# Patient Record
Sex: Male | Born: 1985
Health system: Southern US, Community
[De-identification: ages and names within clinical notes are randomized; demographics above are authoritative.]

## PROBLEM LIST (undated history)

## (undated) DIAGNOSIS — D649 Anemia, unspecified: Secondary | ICD-10-CM

## (undated) DIAGNOSIS — R569 Unspecified convulsions: Secondary | ICD-10-CM

## (undated) HISTORY — DX: Anemia, unspecified: D64.9

## (undated) HISTORY — DX: Unspecified convulsions: R56.9

---

## 2006-08-10 ENCOUNTER — Emergency Department: Payer: Self-pay | Admitting: Emergency Medicine

## 2006-10-19 ENCOUNTER — Ambulatory Visit: Payer: Self-pay | Admitting: Psychiatry

## 2009-06-17 ENCOUNTER — Ambulatory Visit: Payer: Self-pay | Admitting: Family Medicine

## 2012-01-19 DIAGNOSIS — Z0000 Encounter for general adult medical examination without abnormal findings: Secondary | ICD-10-CM | POA: Diagnosis not present

## 2012-01-19 DIAGNOSIS — Z23 Encounter for immunization: Secondary | ICD-10-CM | POA: Diagnosis not present

## 2012-01-19 DIAGNOSIS — Z136 Encounter for screening for cardiovascular disorders: Secondary | ICD-10-CM | POA: Diagnosis not present

## 2012-02-15 ENCOUNTER — Emergency Department: Payer: Self-pay | Admitting: Emergency Medicine

## 2012-11-08 DIAGNOSIS — M7650 Patellar tendinitis, unspecified knee: Secondary | ICD-10-CM | POA: Diagnosis not present

## 2012-11-08 DIAGNOSIS — Z23 Encounter for immunization: Secondary | ICD-10-CM | POA: Diagnosis not present

## 2012-11-25 DIAGNOSIS — Z23 Encounter for immunization: Secondary | ICD-10-CM | POA: Diagnosis not present

## 2012-11-25 DIAGNOSIS — M7650 Patellar tendinitis, unspecified knee: Secondary | ICD-10-CM | POA: Diagnosis not present

## 2012-11-30 DIAGNOSIS — M6580 Other synovitis and tenosynovitis, unspecified site: Secondary | ICD-10-CM | POA: Diagnosis not present

## 2013-04-28 ENCOUNTER — Ambulatory Visit: Payer: Self-pay | Admitting: Family Medicine

## 2013-04-28 DIAGNOSIS — R937 Abnormal findings on diagnostic imaging of other parts of musculoskeletal system: Secondary | ICD-10-CM | POA: Diagnosis not present

## 2013-04-28 DIAGNOSIS — M25549 Pain in joints of unspecified hand: Secondary | ICD-10-CM | POA: Diagnosis not present

## 2013-04-28 DIAGNOSIS — M79609 Pain in unspecified limb: Secondary | ICD-10-CM | POA: Diagnosis not present

## 2013-04-28 DIAGNOSIS — S6990XA Unspecified injury of unspecified wrist, hand and finger(s), initial encounter: Secondary | ICD-10-CM | POA: Diagnosis not present

## 2013-04-28 DIAGNOSIS — Z23 Encounter for immunization: Secondary | ICD-10-CM | POA: Diagnosis not present

## 2013-11-01 ENCOUNTER — Ambulatory Visit: Payer: Self-pay | Admitting: Family Medicine

## 2013-11-01 DIAGNOSIS — S99919A Unspecified injury of unspecified ankle, initial encounter: Secondary | ICD-10-CM | POA: Diagnosis not present

## 2013-11-01 DIAGNOSIS — M25569 Pain in unspecified knee: Secondary | ICD-10-CM | POA: Diagnosis not present

## 2013-11-01 DIAGNOSIS — S99929A Unspecified injury of unspecified foot, initial encounter: Secondary | ICD-10-CM | POA: Diagnosis not present

## 2013-11-01 DIAGNOSIS — Z23 Encounter for immunization: Secondary | ICD-10-CM | POA: Diagnosis not present

## 2013-11-01 DIAGNOSIS — M7989 Other specified soft tissue disorders: Secondary | ICD-10-CM | POA: Diagnosis not present

## 2013-11-01 DIAGNOSIS — S8990XA Unspecified injury of unspecified lower leg, initial encounter: Secondary | ICD-10-CM | POA: Diagnosis not present

## 2013-12-21 ENCOUNTER — Ambulatory Visit: Payer: Self-pay | Admitting: Family Medicine

## 2013-12-21 DIAGNOSIS — M25579 Pain in unspecified ankle and joints of unspecified foot: Secondary | ICD-10-CM | POA: Diagnosis not present

## 2013-12-21 DIAGNOSIS — M7989 Other specified soft tissue disorders: Secondary | ICD-10-CM | POA: Diagnosis not present

## 2013-12-21 DIAGNOSIS — S99919A Unspecified injury of unspecified ankle, initial encounter: Secondary | ICD-10-CM | POA: Diagnosis not present

## 2013-12-21 DIAGNOSIS — Z23 Encounter for immunization: Secondary | ICD-10-CM | POA: Diagnosis not present

## 2013-12-21 DIAGNOSIS — S8990XA Unspecified injury of unspecified lower leg, initial encounter: Secondary | ICD-10-CM | POA: Diagnosis not present

## 2014-03-16 IMAGING — CR DG HAND COMPLETE 3+V*L*
1 series · 3 of 3 positions shown · non-contrast
Comparison: None.

CLINICAL DATA: Pain post trauma

EXAM:
LEFT HAND - COMPLETE 3+ VIEW

[Series 1: pa · 0.17mm/px · 3 of 3 slices shown]
[im 1/3]
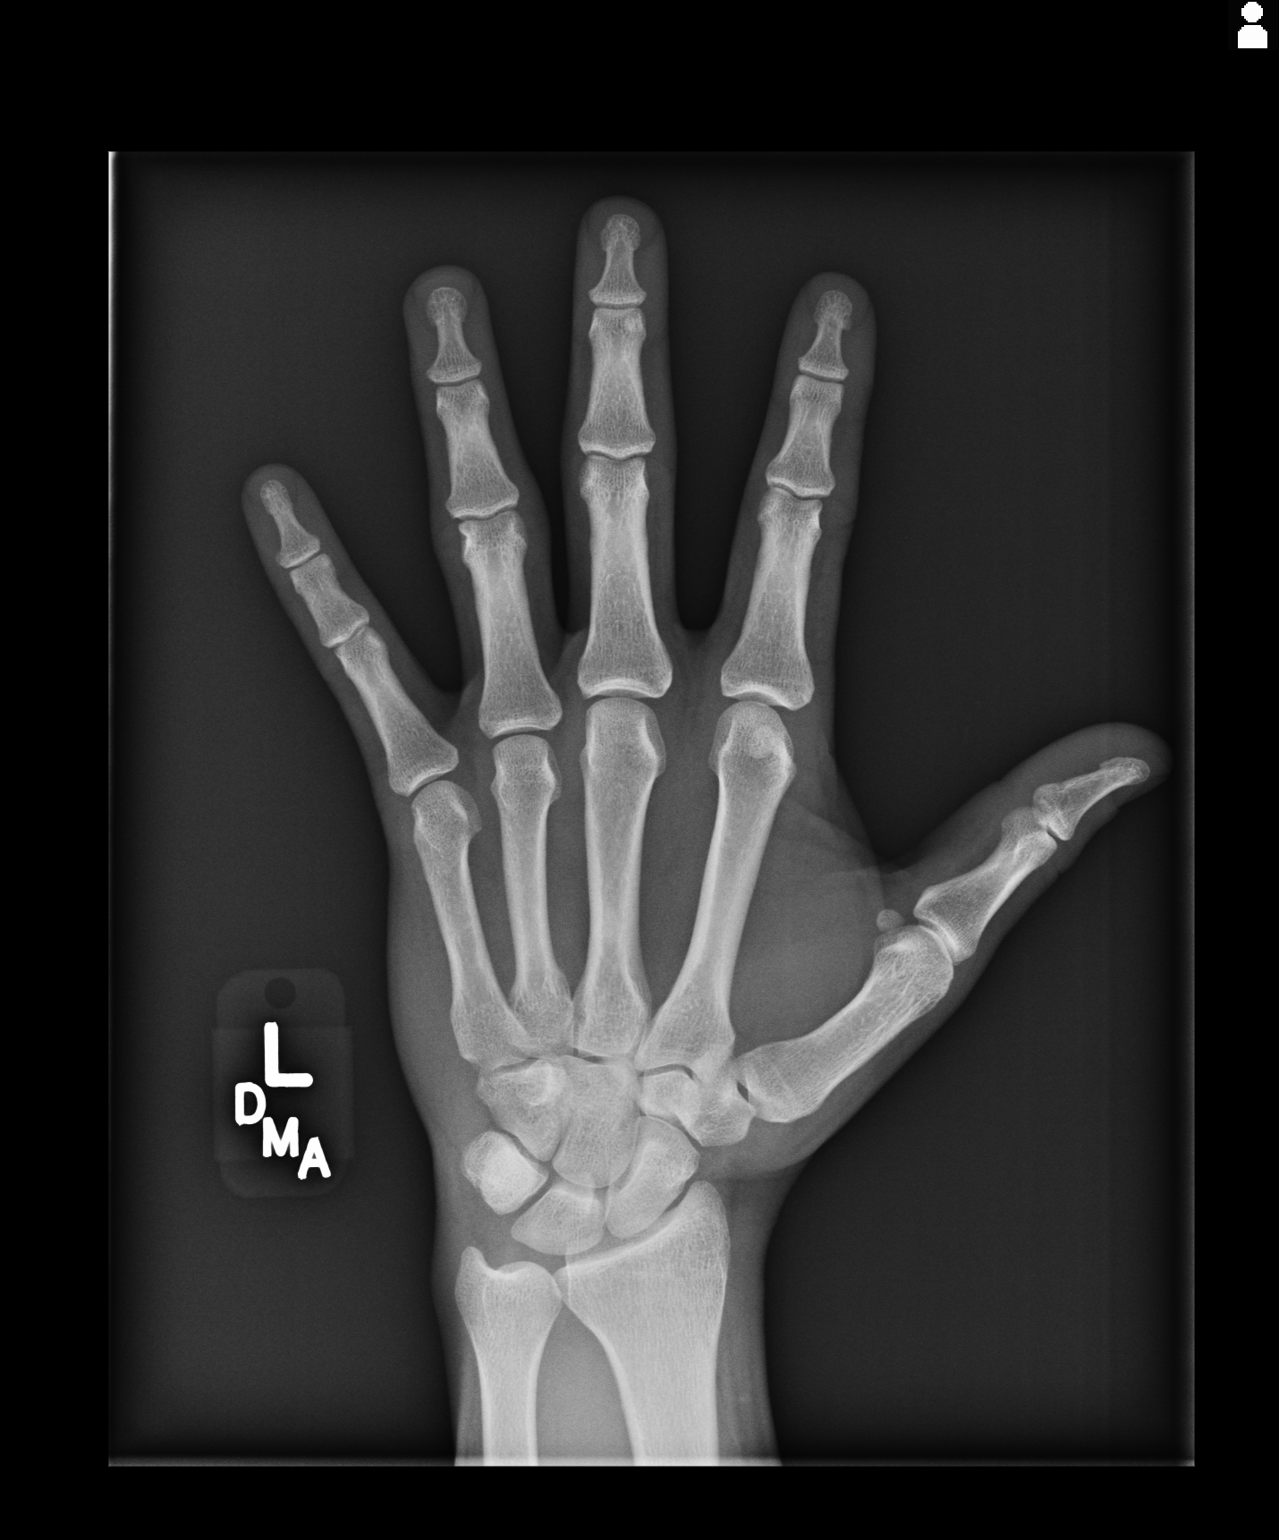
[im 2/3]
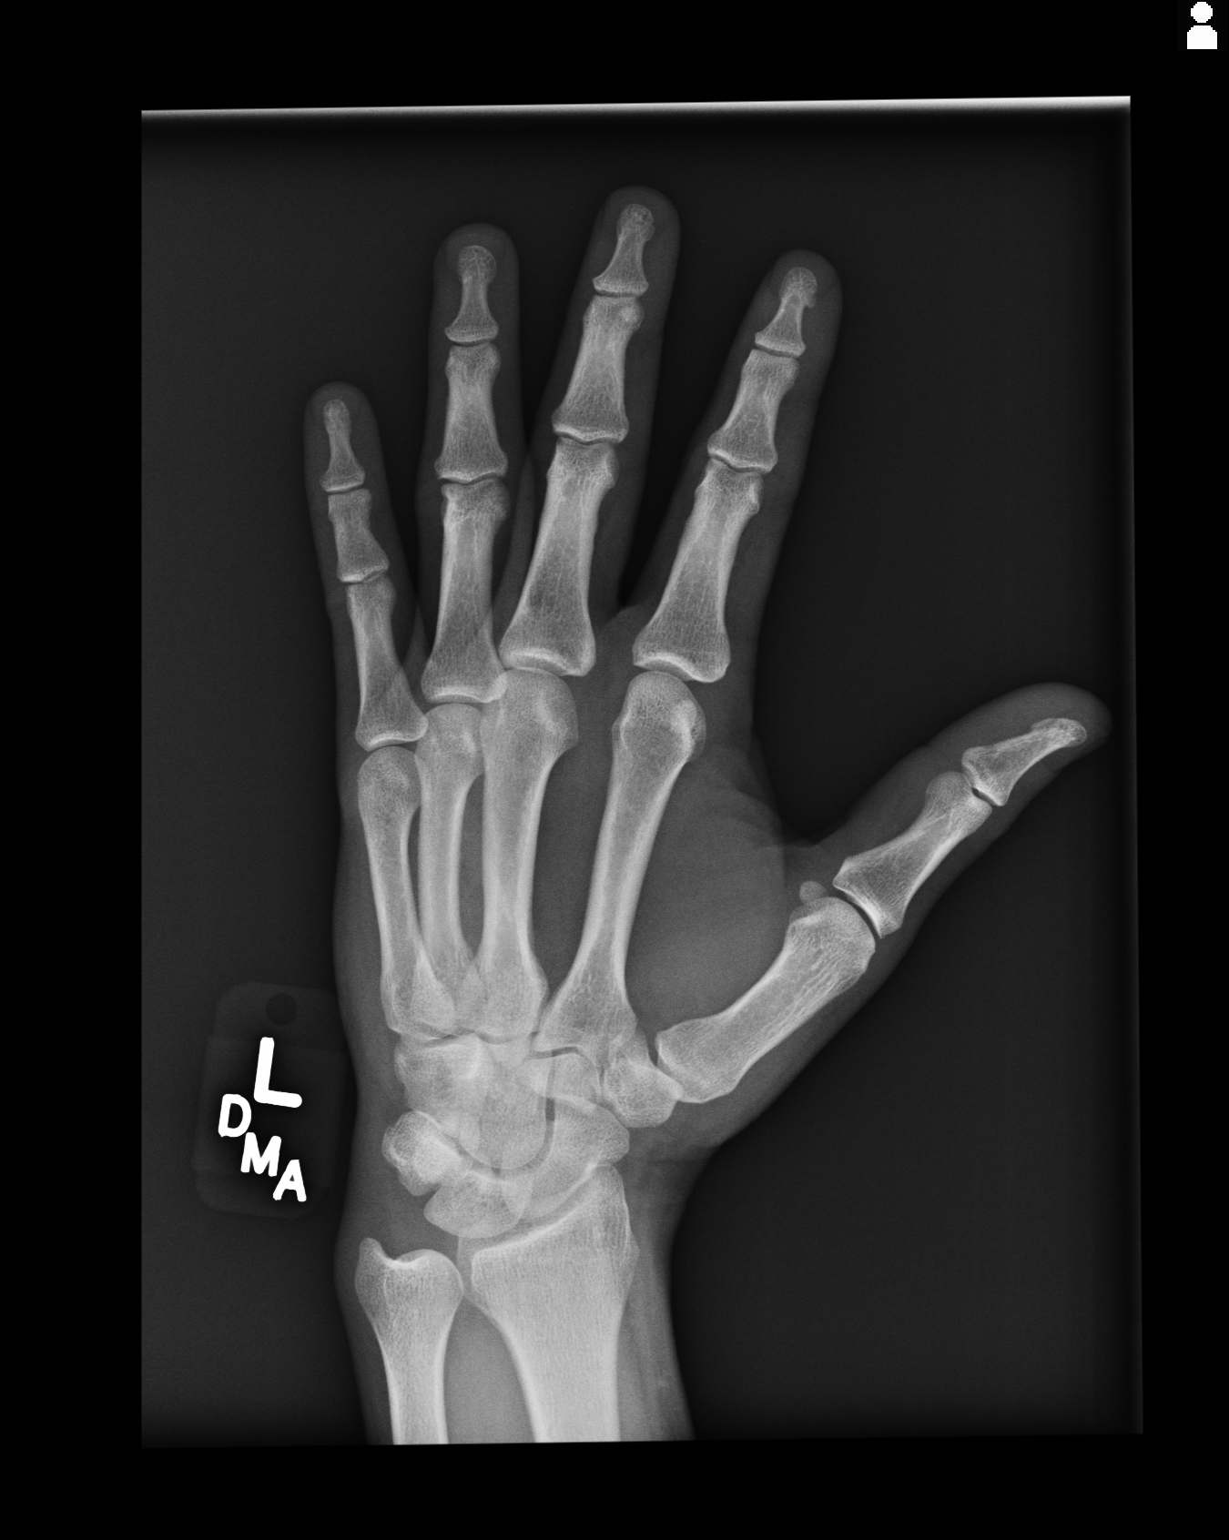
[im 3/3]
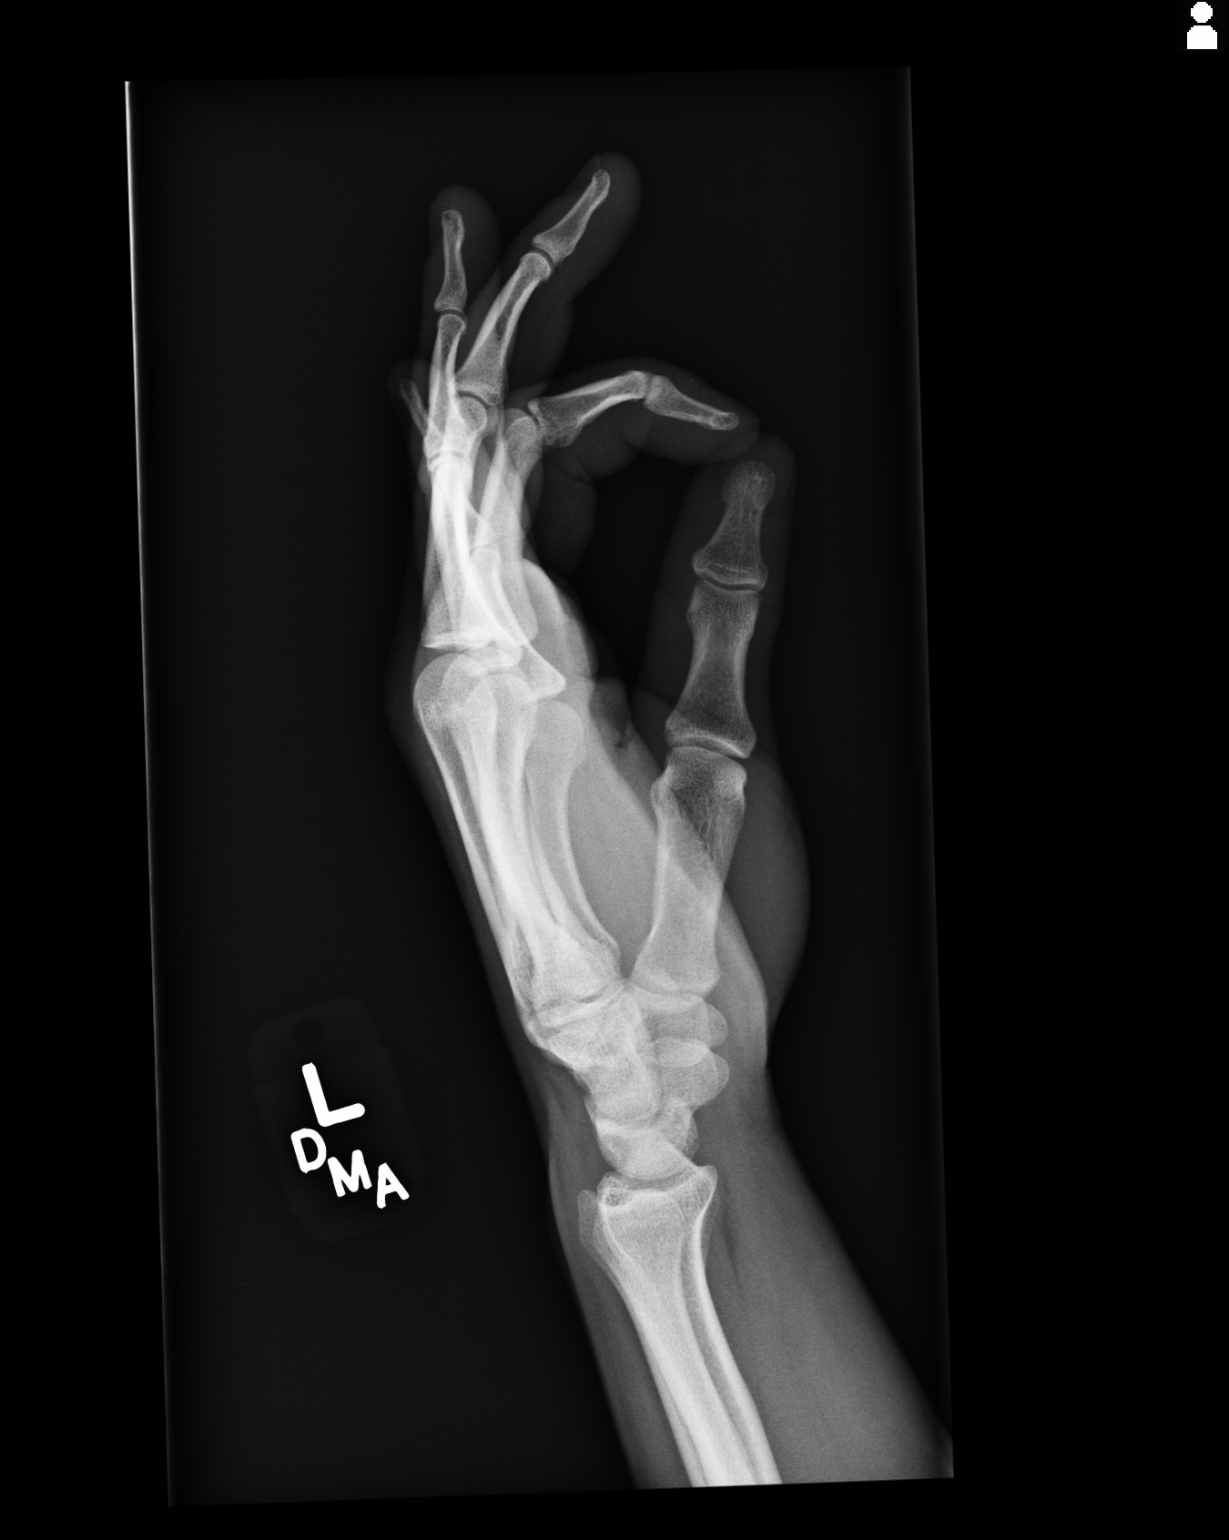

[3 of 3 positions shown; findings below may reference images not displayed]

FINDINGS: Frontal, oblique, and lateral views were obtained. On the lateral
view, there is a questionable non of displaced fracture of the
proximal portion of the 2nd middle phalanx. There is no other
evidence of fracture. No dislocation. Joint spaces appear intact.
IMPRESSION: On the lateral view only, there is a lucency in the proximal portion
of the 2nd middle phalanx suspicious for nondisplaced fracture.
Study otherwise unremarkable.

## 2014-09-19 IMAGING — CR LEFT KNEE - 3 VIEW
1 series · 3 of 3 positions shown · non-contrast
Comparison: None.

CLINICAL DATA: Status post fall on [REDACTED] with anterior knee pain
and swelling.

EXAM:
LEFT KNEE - 3 VIEW

[Series 1: ap · 0.17mm/px · 3 of 3 slices shown]
[im 1/3]
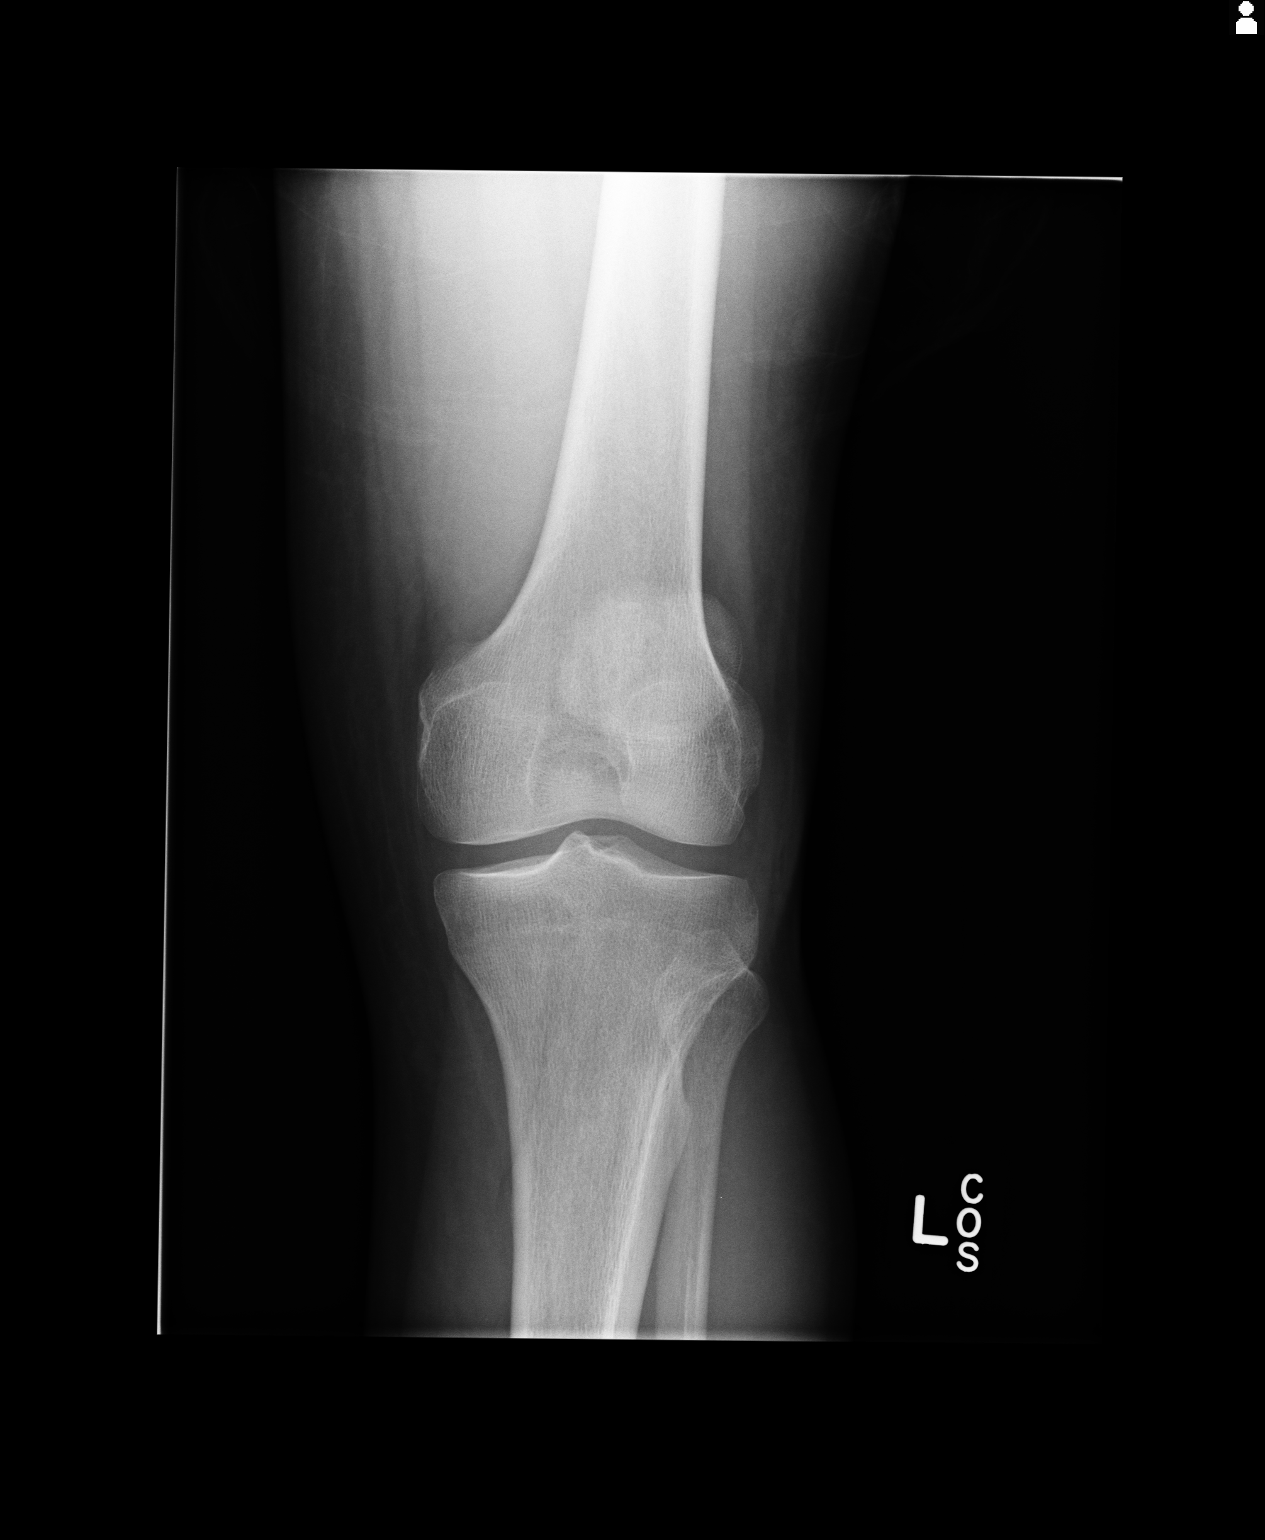
[im 2/3]
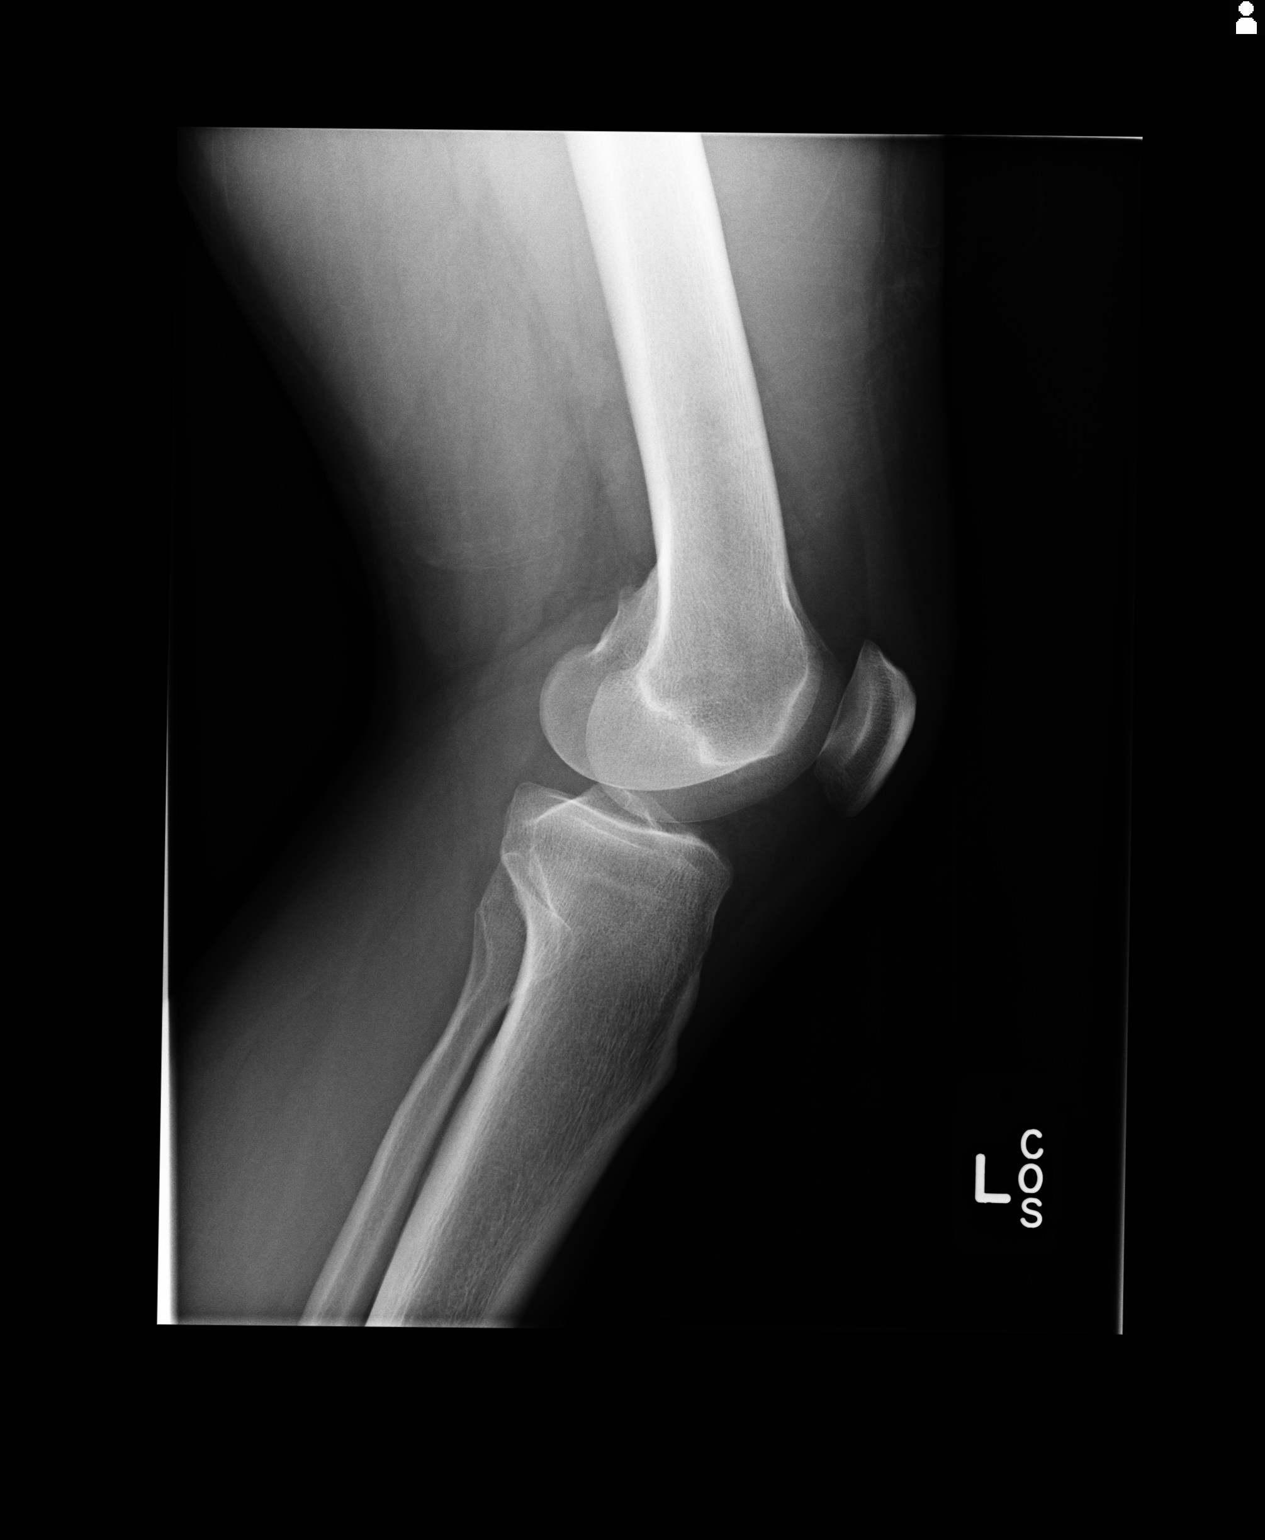
[im 3/3]
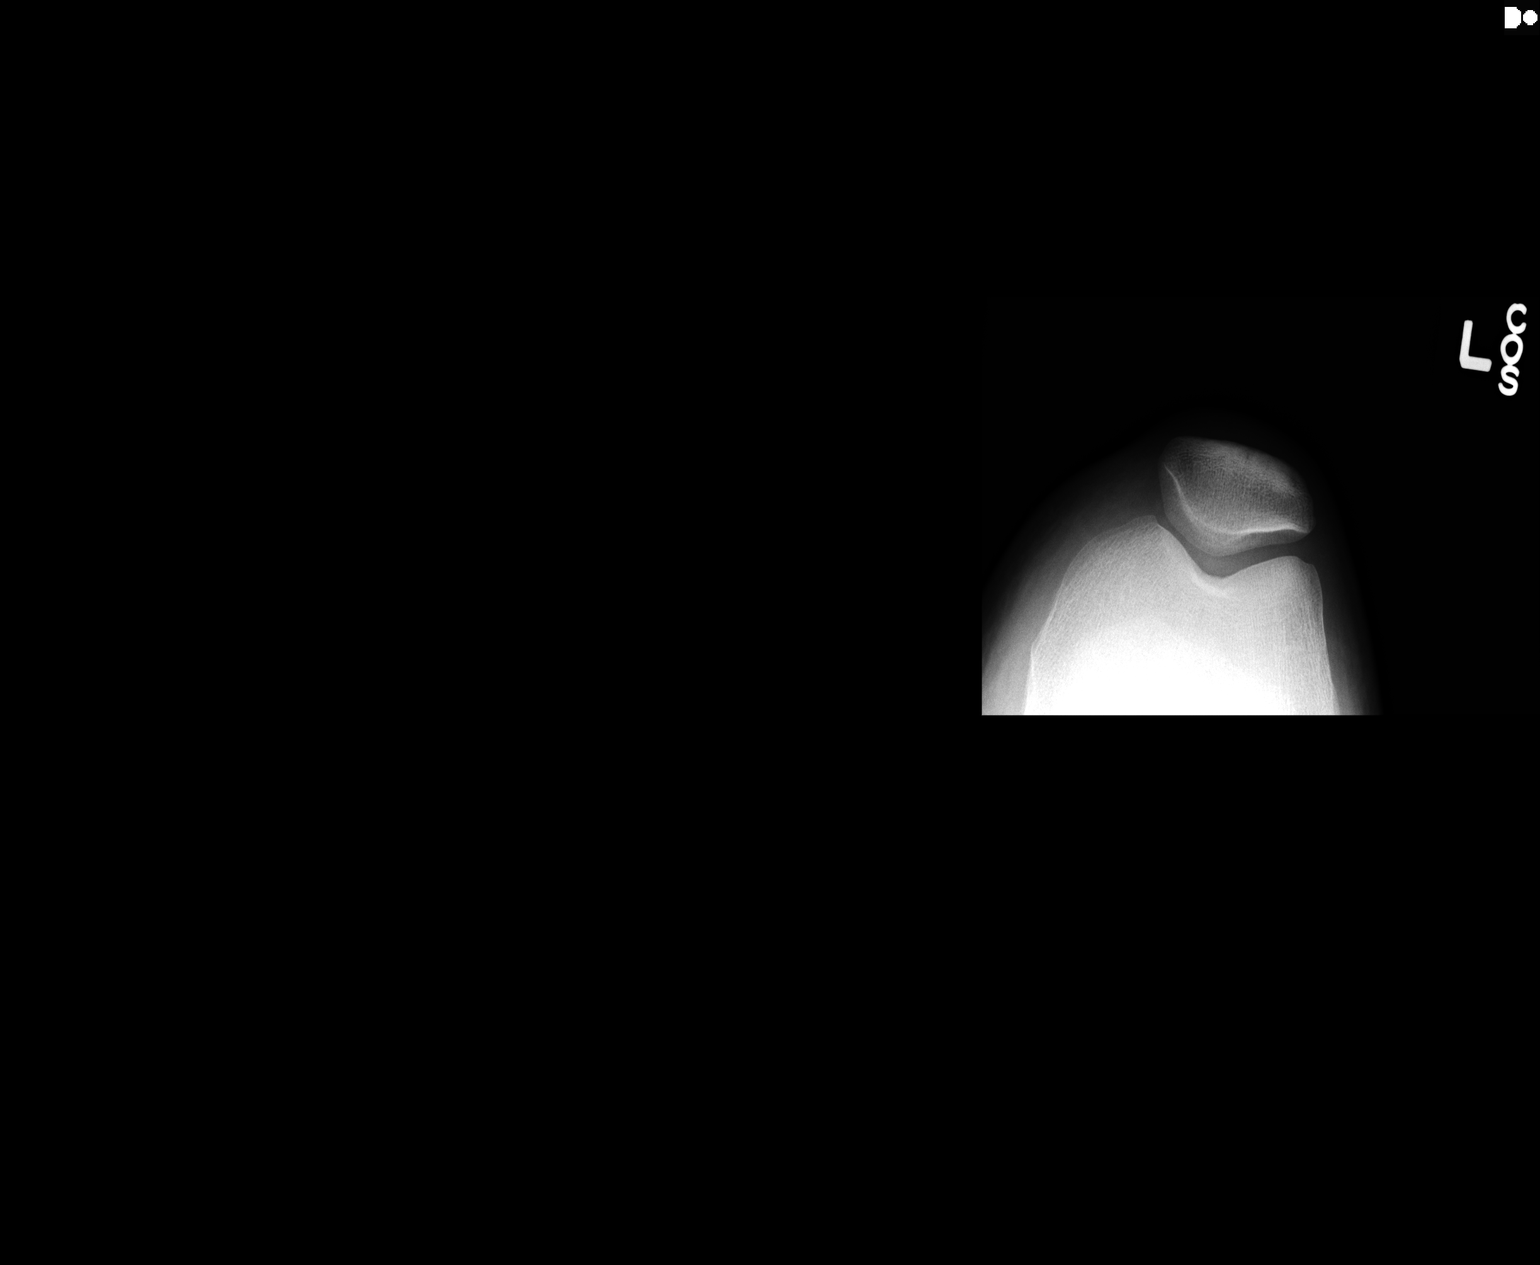

[3 of 3 positions shown; findings below may reference images not displayed]

FINDINGS: There is no evidence of fracture, dislocation, or joint effusion.
There is no evidence of arthropathy or other focal bone abnormality.
Soft tissues are unremarkable.
IMPRESSION: No acute fracture or dislocation.

## 2014-11-08 IMAGING — CR RIGHT ANKLE - COMPLETE 3+ VIEW
1 series · 3 of 3 positions shown · non-contrast
Comparison: None.

CLINICAL DATA: Right ankle pain.

EXAM:
RIGHT ANKLE - COMPLETE 3+ VIEW

[Series 1: kdxr ankle right complete · 0.14mm/px · 3 of 3 slices shown]
[im 1/3]
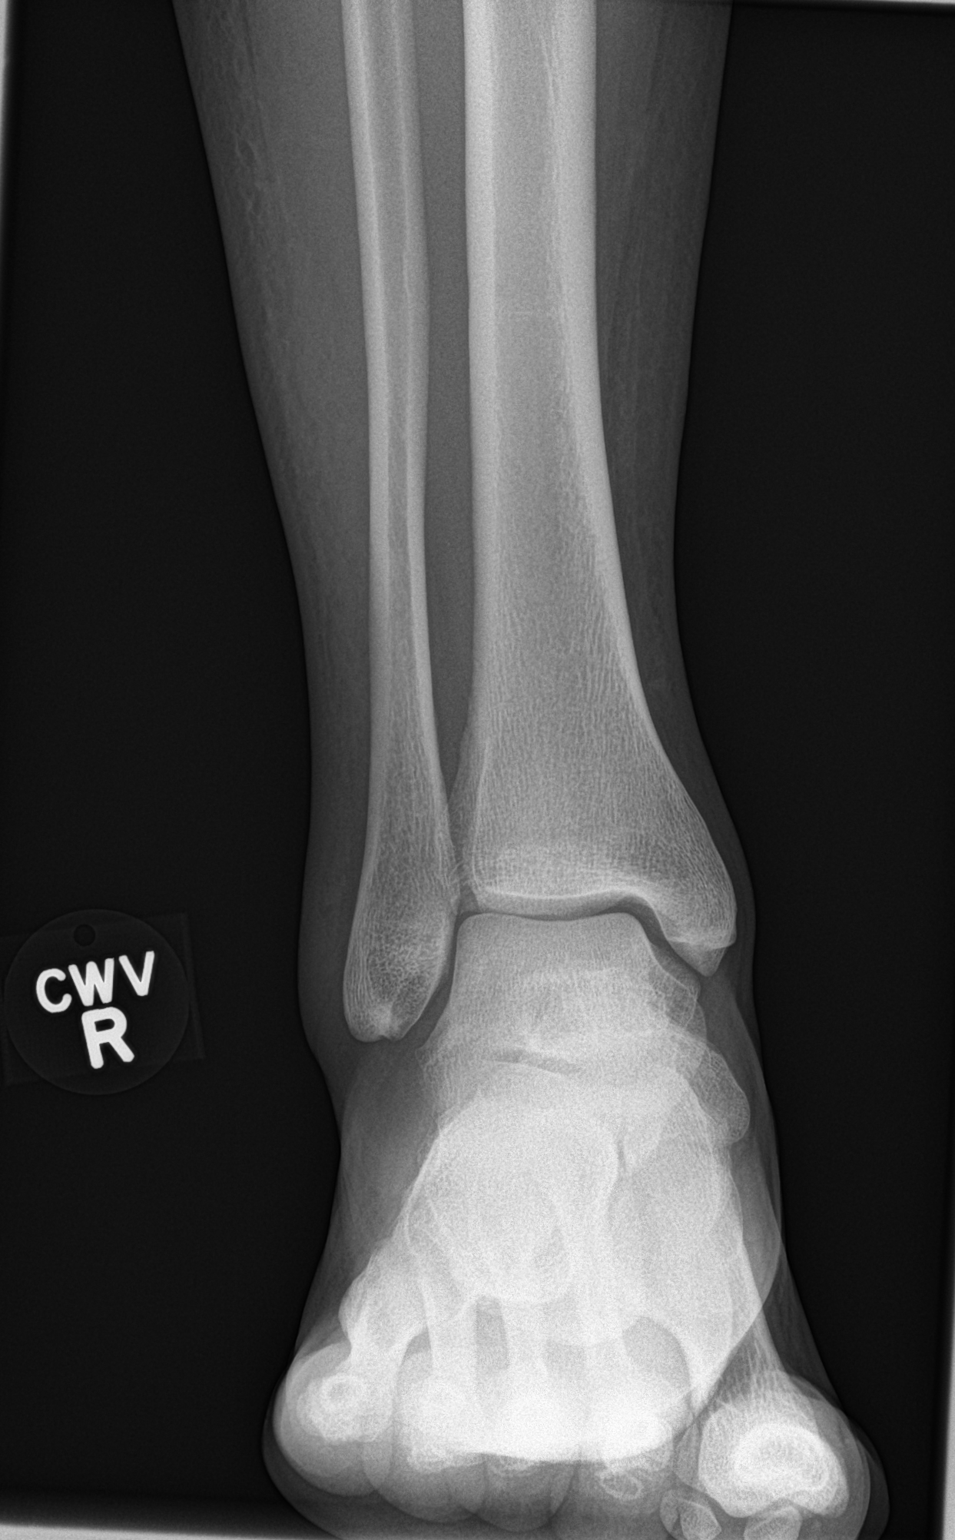
[im 2/3]
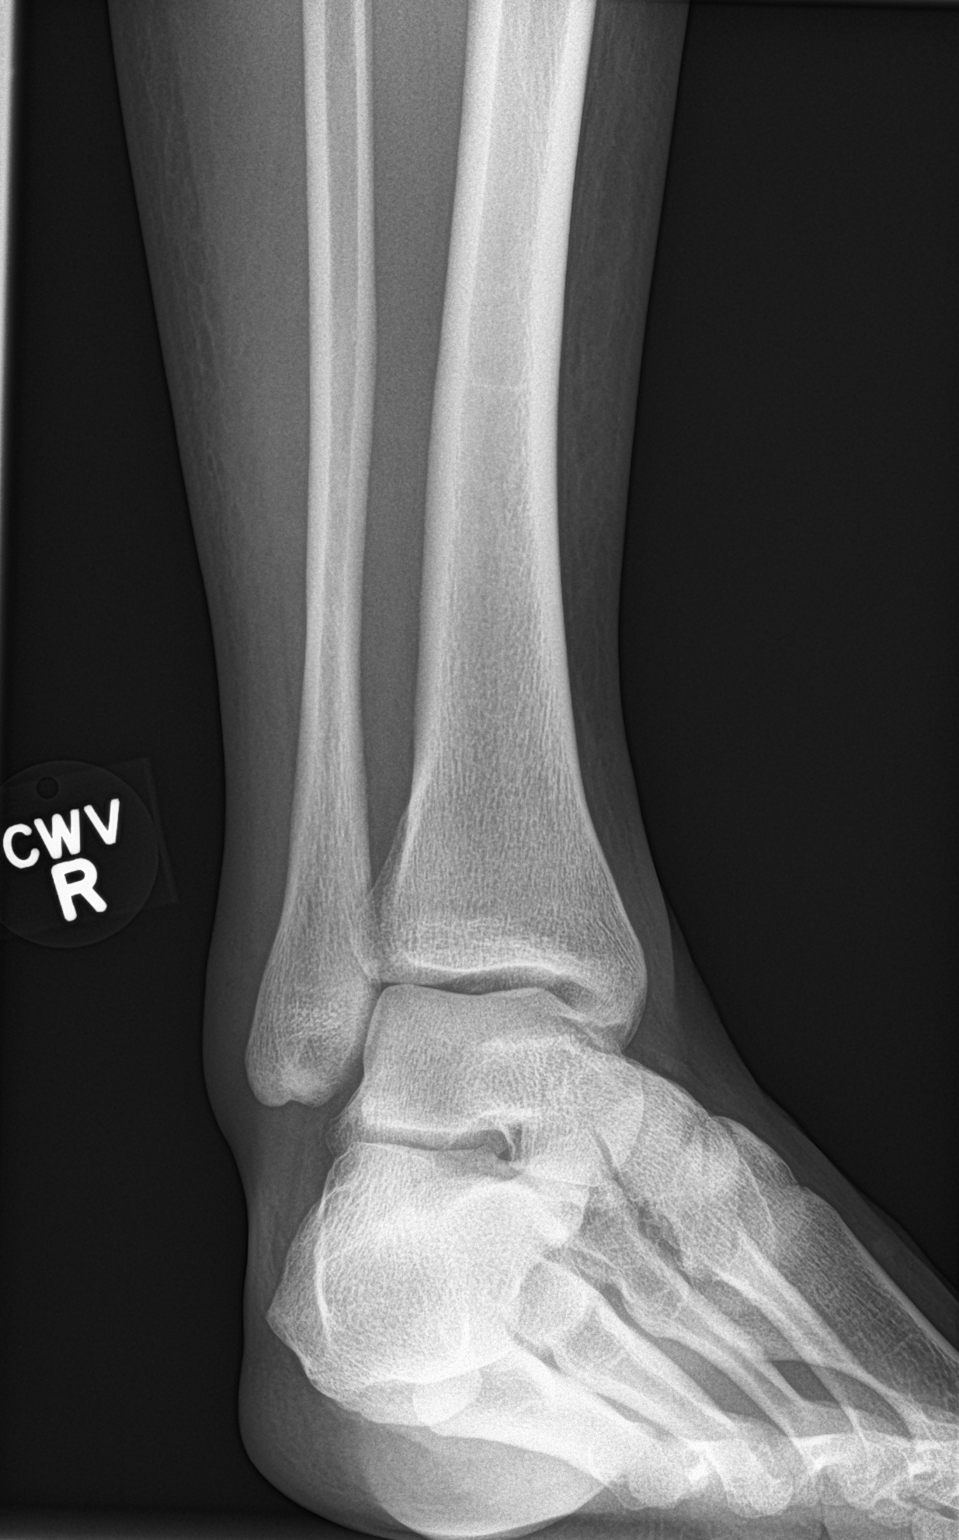
[im 3/3]
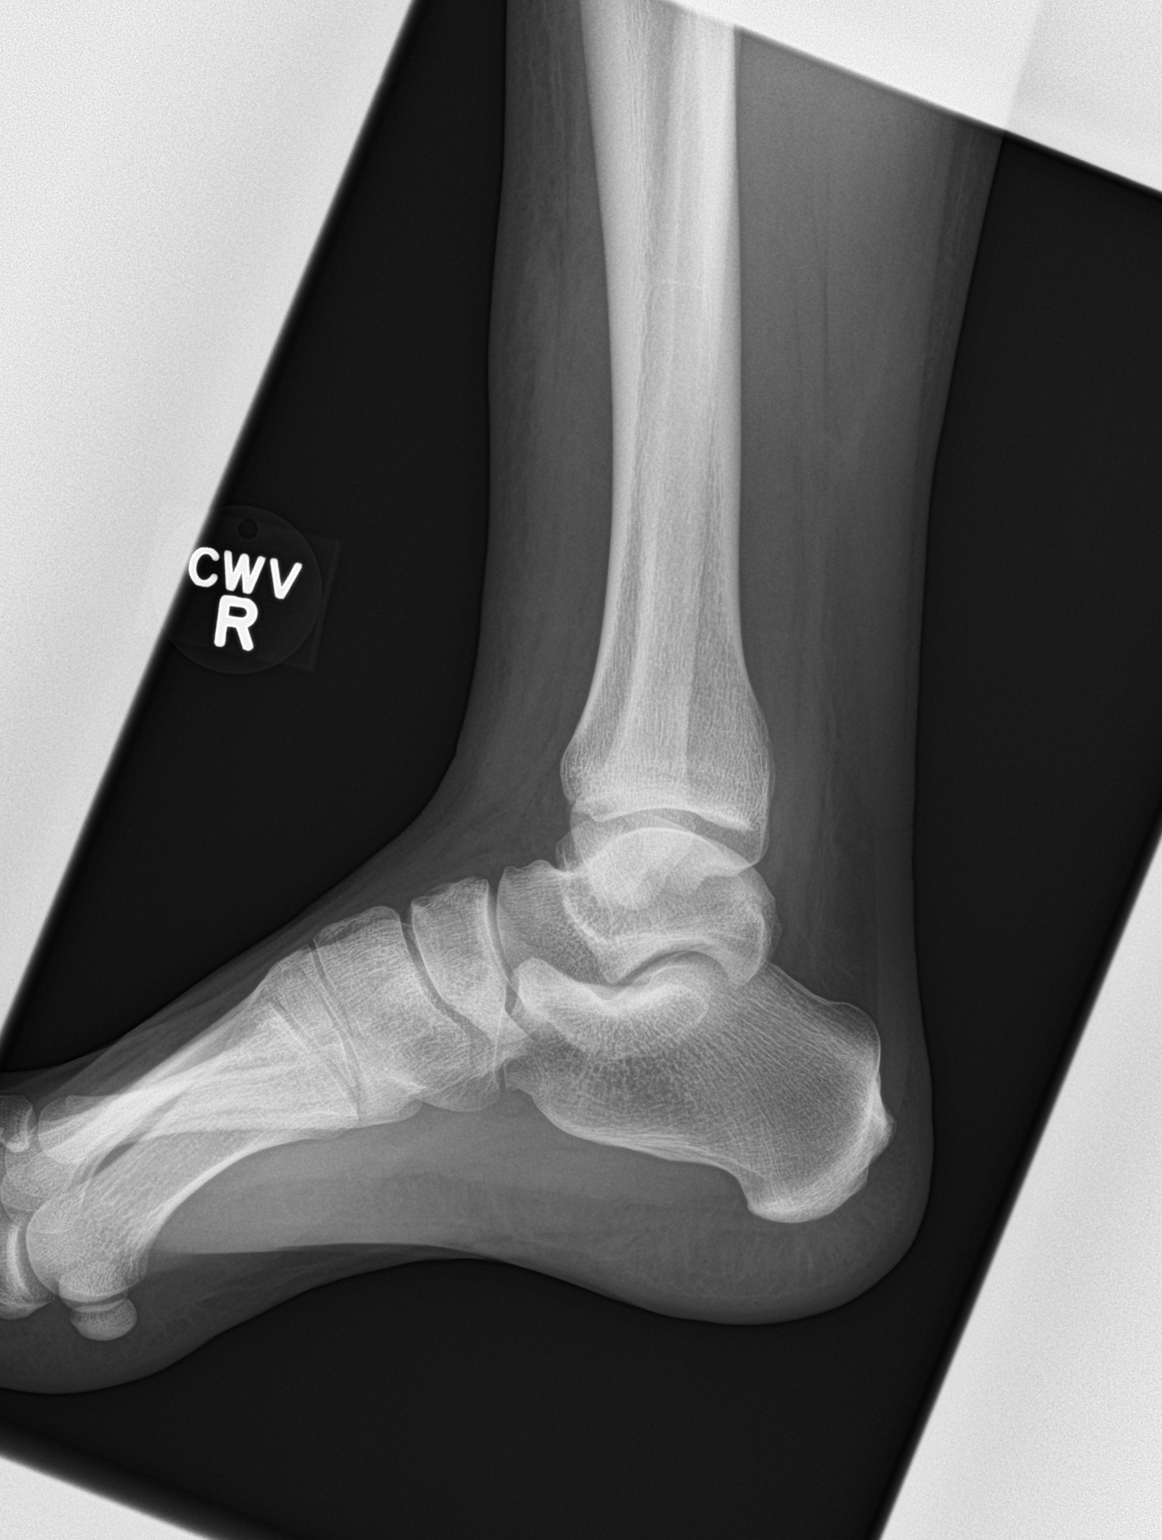

[3 of 3 positions shown; findings below may reference images not displayed]

FINDINGS: There is no evidence of fracture, dislocation, or joint effusion.
There is no evidence of arthropathy or other focal bone abnormality.
Soft tissue swelling is seen over lateral malleolus suggesting
ligamentous injury.
IMPRESSION: No fracture or dislocation is noted. Soft tissue swelling seen over
lateral malleolus suggesting ligamentous injury.

## 2016-11-18 DIAGNOSIS — S43401A Unspecified sprain of right shoulder joint, initial encounter: Secondary | ICD-10-CM | POA: Diagnosis not present

## 2022-02-26 ENCOUNTER — Emergency Department: Payer: No Typology Code available for payment source

## 2022-02-26 ENCOUNTER — Emergency Department
Admission: EM | Admit: 2022-02-26 | Discharge: 2022-02-27 | Disposition: A | Discharge status: Home / Self Care | Payer: No Typology Code available for payment source | Attending: Emergency Medicine | Admitting: Emergency Medicine

## 2022-02-26 ENCOUNTER — Other Ambulatory Visit: Payer: Self-pay

## 2022-02-26 DIAGNOSIS — R569 Unspecified convulsions: Secondary | ICD-10-CM | POA: Diagnosis not present

## 2022-02-26 DIAGNOSIS — R410 Disorientation, unspecified: Secondary | ICD-10-CM | POA: Diagnosis not present

## 2022-02-26 DIAGNOSIS — E876 Hypokalemia: Secondary | ICD-10-CM

## 2022-02-26 DIAGNOSIS — R9431 Abnormal electrocardiogram [ECG] [EKG]: Secondary | ICD-10-CM | POA: Diagnosis not present

## 2022-02-26 LAB — COMPREHENSIVE METABOLIC PANEL
ALT: 17 U/L (ref 0–44)
AST: 21 U/L (ref 15–41)
Albumin: 4.5 g/dL (ref 3.5–5.0)
Alkaline Phosphatase: 82 U/L (ref 38–126)
Anion gap: 8 (ref 5–15)
BUN: 9 mg/dL (ref 6–20)
CO2: 26 mmol/L (ref 22–32)
Calcium: 8.9 mg/dL (ref 8.9–10.3)
Chloride: 103 mmol/L (ref 98–111)
Creatinine, Ser: 1.11 mg/dL (ref 0.61–1.24)
GFR, Estimated: >60 mL/min (ref >60)
Glucose, Bld: 132 mg/dL — ABNORMAL HIGH (ref 70–99)
Potassium: 3.2 mmol/L — ABNORMAL LOW (ref 3.5–5.1)
Sodium: 137 mmol/L (ref 135–145)
Total Bilirubin: 1.5 mg/dL — ABNORMAL HIGH (ref 0.3–1.2)
Total Protein: 7.7 g/dL (ref 6.5–8.1)

## 2022-02-26 LAB — CBC
HCT: 45.3 % (ref 39.0–52.0)
Hemoglobin: 15.4 g/dL (ref 13.0–17.0)
MCH: 30.8 pg (ref 26.0–34.0)
MCHC: 34 g/dL (ref 30.0–36.0)
MCV: 90.6 fL (ref 80.0–100.0)
Platelets: 251 10*3/uL (ref 150–400)
RBC: 5 MIL/uL (ref 4.22–5.81)
RDW: 11.3 % — ABNORMAL LOW (ref 11.5–15.5)
WBC: 7 10*3/uL (ref 4.0–10.5)
nRBC: 0 % (ref 0.0–0.2)

## 2022-02-26 MED ORDER — LEVETIRACETAM IN NACL 1500 MG/100ML IV SOLN
1500.0000 mg | Freq: Once | INTRAVENOUS | Status: AC
  Administered 2022-02-26: 1500 mg via INTRAVENOUS
  Filled 2022-02-26: qty 100

## 2022-02-26 MED ORDER — SODIUM CHLORIDE 0.9 % IV BOLUS
1000.0000 mL | Freq: Once | INTRAVENOUS | Status: AC
  Administered 2022-02-26: 1000 mL via INTRAVENOUS

## 2022-02-26 MED ORDER — POTASSIUM CHLORIDE CRYS ER 20 MEQ PO TBCR
40.0000 meq | EXTENDED_RELEASE_TABLET | Freq: Once | ORAL | Status: AC
  Administered 2022-02-27: 40 meq via ORAL
  Filled 2022-02-26: qty 2

## 2022-02-26 MED ORDER — LEVETIRACETAM 500 MG PO TABS: 500.0000 mg | ORAL_TABLET | Freq: Two times a day (BID) | ORAL | 1 refills | Status: DC

## 2022-02-26 NOTE — ED Triage Notes (Signed)
Pt coming from home via [facility]. Per EMS, pt's sister heard pt fall and hit his head. Pt had seizure like activity for about 1.5 minutes. Pt has a hx of TBI and seizures but pt's last seizure was about 15 years.   Pt is currently A&Ox4 upon arrival. Vital signs were stable with EMS.

## 2022-02-26 NOTE — Discharge Instructions (Signed)
Please call the number provided for neurology to arrange a follow-up appointment soon as possible.  Please begin taking your antiseizure medicine.  Return to the emergency department for any further seizure-like activity.  Do not drive or swim alone until you have been cleared by neurology.
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
## 2022-02-27 DIAGNOSIS — R569 Unspecified convulsions: Secondary | ICD-10-CM | POA: Diagnosis not present

## 2022-02-27 LAB — URINALYSIS, COMPLETE (UACMP) WITH MICROSCOPIC
Bacteria, UA: NONE SEEN
Bilirubin Urine: NEGATIVE
Glucose, UA: NEGATIVE mg/dL
Hgb urine dipstick: NEGATIVE
Ketones, ur: NEGATIVE mg/dL
Leukocytes,Ua: NEGATIVE
Nitrite: NEGATIVE
Protein, ur: NEGATIVE mg/dL
Specific Gravity, Urine: 1.015 (ref 1.005–1.030)
Squamous Epithelial / HPF: NONE SEEN (ref 0–5)
pH: 7.5 (ref 5.0–8.0)

## 2022-02-27 LAB — URINE DRUG SCREEN, QUALITATIVE (ARMC ONLY)
Amphetamines, Ur Screen: NOT DETECTED
Barbiturates, Ur Screen: NOT DETECTED
Benzodiazepine, Ur Scrn: NOT DETECTED
Cannabinoid 50 Ng, Ur ~~LOC~~: NOT DETECTED
Cocaine Metabolite,Ur ~~LOC~~: NOT DETECTED
MDMA (Ecstasy)Ur Screen: NOT DETECTED
Methadone Scn, Ur: NOT DETECTED
Opiate, Ur Screen: NOT DETECTED
Phencyclidine (PCP) Ur S: NOT DETECTED
Tricyclic, Ur Screen: NOT DETECTED

## 2022-02-27 NOTE — ED Notes (Signed)
Pt verbalized understanding of discharge instructions, prescriptions, and follow-up care instructions.  

## 2022-02-27 NOTE — ED Provider Notes (Signed)
Patient received in signout from Dr. Kerman Passey pending remainder of work-up with CT scan and UA.  This returns quite reassuring without evidence of intracranial pathology acutely, UTI, sepsis or other derangements.  He is noted to be slightly hypokalemic, and this repleted orally.  He is observed for nearly 4 hours without recurrence of seizure activity and he remains at his baseline.  Discussed with patient and family to follow-up with neurology, start Keppra and we discussed return precautions.  Answered questions.   Vladimir Crofts, MD 02/27/22 [phone]

## 2022-05-21 DIAGNOSIS — E876 Hypokalemia: Secondary | ICD-10-CM | POA: Diagnosis not present

## 2022-05-21 DIAGNOSIS — Z87820 Personal history of traumatic brain injury: Secondary | ICD-10-CM | POA: Diagnosis not present

## 2022-05-21 DIAGNOSIS — E538 Deficiency of other specified B group vitamins: Secondary | ICD-10-CM | POA: Diagnosis not present

## 2022-05-21 DIAGNOSIS — R4189 Other symptoms and signs involving cognitive functions and awareness: Secondary | ICD-10-CM | POA: Diagnosis not present

## 2022-05-21 DIAGNOSIS — R569 Unspecified convulsions: Secondary | ICD-10-CM | POA: Diagnosis not present

## 2022-06-10 DIAGNOSIS — R569 Unspecified convulsions: Secondary | ICD-10-CM | POA: Diagnosis not present

## 2022-07-17 DIAGNOSIS — L0291 Cutaneous abscess, unspecified: Secondary | ICD-10-CM | POA: Diagnosis not present

## 2022-07-17 DIAGNOSIS — Z87820 Personal history of traumatic brain injury: Secondary | ICD-10-CM | POA: Diagnosis not present

## 2022-07-17 DIAGNOSIS — Z23 Encounter for immunization: Secondary | ICD-10-CM | POA: Diagnosis not present

## 2022-07-17 DIAGNOSIS — M79645 Pain in left finger(s): Secondary | ICD-10-CM | POA: Diagnosis not present

## 2022-07-17 DIAGNOSIS — R569 Unspecified convulsions: Secondary | ICD-10-CM | POA: Diagnosis not present

## 2022-07-17 DIAGNOSIS — L539 Erythematous condition, unspecified: Secondary | ICD-10-CM | POA: Diagnosis not present

## 2022-07-17 DIAGNOSIS — L02512 Cutaneous abscess of left hand: Secondary | ICD-10-CM | POA: Diagnosis not present

## 2022-07-17 DIAGNOSIS — M7989 Other specified soft tissue disorders: Secondary | ICD-10-CM | POA: Diagnosis not present

## 2023-01-24 DIAGNOSIS — R21 Rash and other nonspecific skin eruption: Secondary | ICD-10-CM | POA: Diagnosis not present

## 2023-01-24 DIAGNOSIS — S0101XA Laceration without foreign body of scalp, initial encounter: Secondary | ICD-10-CM | POA: Diagnosis not present

## 2023-01-24 DIAGNOSIS — R55 Syncope and collapse: Secondary | ICD-10-CM | POA: Diagnosis not present

## 2023-01-31 DIAGNOSIS — Z4802 Encounter for removal of sutures: Secondary | ICD-10-CM | POA: Diagnosis not present

## 2023-09-13 ENCOUNTER — Emergency Department: Admission: EM | Admit: 2023-09-13 | Discharge: 2023-09-13 | Disposition: A | Discharge status: Home / Self Care

## 2023-09-13 ENCOUNTER — Other Ambulatory Visit: Payer: Self-pay

## 2023-09-13 DIAGNOSIS — G40909 Epilepsy, unspecified, not intractable, without status epilepticus: Secondary | ICD-10-CM | POA: Diagnosis not present

## 2023-09-13 DIAGNOSIS — R569 Unspecified convulsions: Secondary | ICD-10-CM | POA: Diagnosis not present

## 2023-09-13 LAB — CBC WITH DIFFERENTIAL/PLATELET
Abs Immature Granulocytes: 0.03 10*3/uL (ref 0.00–0.07)
Basophils Absolute: 0.1 10*3/uL (ref 0.0–0.1)
Basophils Relative: 1 %
Eosinophils Absolute: 0.2 10*3/uL (ref 0.0–0.5)
Eosinophils Relative: 2 %
HCT: 44 % (ref 39.0–52.0)
Hemoglobin: 15.7 g/dL (ref 13.0–17.0)
Immature Granulocytes: 0 %
Lymphocytes Relative: 15 %
Lymphs Abs: 1.2 10*3/uL (ref 0.7–4.0)
MCH: 32.2 pg (ref 26.0–34.0)
MCHC: 35.7 g/dL (ref 30.0–36.0)
MCV: 90.3 fL (ref 80.0–100.0)
Monocytes Absolute: 0.7 10*3/uL (ref 0.1–1.0)
Monocytes Relative: 8 %
Neutro Abs: 6.1 10*3/uL (ref 1.7–7.7)
Neutrophils Relative %: 74 %
Platelets: 207 10*3/uL (ref 150–400)
RBC: 4.87 MIL/uL (ref 4.22–5.81)
RDW: 11.4 % — ABNORMAL LOW (ref 11.5–15.5)
WBC: 8.3 10*3/uL (ref 4.0–10.5)
nRBC: 0 % (ref 0.0–0.2)

## 2023-09-13 LAB — BASIC METABOLIC PANEL WITH GFR
Anion gap: 10 (ref 5–15)
BUN: 13 mg/dL (ref 6–20)
CO2: 23 mmol/L (ref 22–32)
Calcium: 8.9 mg/dL (ref 8.9–10.3)
Chloride: 105 mmol/L (ref 98–111)
Creatinine, Ser: 0.98 mg/dL (ref 0.61–1.24)
GFR, Estimated: >60 mL/min (ref >60)
Glucose, Bld: 88 mg/dL (ref 70–99)
Potassium: 3.5 mmol/L (ref 3.5–5.1)
Sodium: 138 mmol/L (ref 135–145)

## 2023-09-13 MED ORDER — LEVETIRACETAM 500 MG PO TABS: 500.0000 mg | ORAL_TABLET | Freq: Two times a day (BID) | ORAL | 0 refills | Status: DC

## 2023-09-13 MED ORDER — LEVETIRACETAM 500 MG PO TABS
500.0000 mg | ORAL_TABLET | Freq: Once | ORAL | Status: AC
  Administered 2023-09-13: 500 mg via ORAL
  Filled 2023-09-13: qty 1

## 2023-09-13 MED ORDER — ACETAMINOPHEN 500 MG PO TABS
1000.0000 mg | ORAL_TABLET | Freq: Once | ORAL | Status: AC
  Administered 2023-09-13: 1000 mg via ORAL
  Filled 2023-09-13: qty 2

## 2023-09-13 NOTE — ED Triage Notes (Signed)
 S: - Pt arrives via EMS for seizure-like activity  B: - Medical Hx: Seizures - Pertinent Medications: Keppra hasn't had filled in months d/t moving A: - EMS Assessment: Disoriented but easily re-oriented - EMS interventions: #20 IV  Is Pt on O2? Is this their baseline? No O2 - This RNs Assessment: Airway - Pt able to hold  Breathing - 100% RA Circulation - >3 seconds  Disability - A/ox4 but a little disoriented Hasn't eaten or had any liquids since yesterday, and has an abrasion on his head where he fell from seizure.  R: - What brought Pt into ER today? 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 Unk how long it lasted for, no witnesses's to verify.

## 2023-09-13 NOTE — Discharge Instructions (Addendum)
 Your evaluation in the emergency department was overall reassuring.  Refilled your seizure medication, and I recommend you take this as prescribed.  Please do follow-up with your primary care doctor and neurologist, and return to the emergency department with any new or worsening symptoms.  As discussed, you should refrain from driving, riding bicycles, swimming, or any other activities that would be dangerous if you had a seizure while doing them.

## 2023-09-13 NOTE — ED Provider Notes (Signed)
 [facility] Provider Note    Event Date/Time   First MD Initiated Contact with Patient 09/13/23 1213     (approximate)   History   Seizures   HPI  Grant Serrano is a 38 y.o. male reported PMH seizure disorder on Keppra presents for evaluation of seizure like episode - Per EMS, patient had a witnessed tonic-clonic seizure.  Glucose normal.  Mildly slow to respond though not floridly postictal.  Does have seizure history, reportedly supposed to be on levetiracetam but recently moved to [place] and has not had this medication in months. -On my eval, patient states no recent trauma, infectious symptoms, substance use      Physical Exam   Triage Vital Signs:  Most recent vital signs: Vitals:   09/13/23 1300 09/13/23 1323  BP: 118/81   Pulse:    Resp: 20   Temp:  97.7 F (36.5 C)  SpO2:     General: Awake, no distress.  HENT:   Mild abrasion to left forehead, no significant underlying hematoma CV:  Good peripheral perfusion.  Regular rate and rhythm, RP 2+. Resp:  Normal effort.  No respiratory distress Abd:  No distention.  Nontender. Neuro:  Aox4, CN II-XII intact, FNF wnl, finger taps fast b/l, 5/5 strength in bilateral finger extension/grip, arm flexion/extension, EHL/FHL. BUE AG 10+ sec no drift, BLE AG 5+ sec no drift. Ambulates with steady gait. SILT. Negative Rhomberg.   ED Results / Procedures / Treatments   Labs (all labs ordered are listed, but only abnormal results are displayed) Labs Reviewed  CBC WITH DIFFERENTIAL/PLATELET - Abnormal; Notable for the following components:      Result Value   RDW 11.4 (*)    All other components within normal limits  BASIC METABOLIC PANEL WITH GFR     EKG  See ED course below   RADIOLOGY N/a    PROCEDURES:  Critical Care performed: No  Procedures   MEDICATIONS ORDERED IN ED: Medications  acetaminophen (TYLENOL) tablet 1,000 mg (1,000 mg Oral Given 09/13/23 1347)   levETIRAcetam (KEPPRA) tablet 500 mg (500 mg Oral Given 09/13/23 1429)     IMPRESSION / MDM / ASSESSMENT AND PLAN / ED COURSE  I reviewed the triage vital signs and the nursing notes.                              Differential diagnosis includes, but is not limited to, uncomplicated seizure in this patient with a known seizure disorder and no other concerning history or exam findings.  No clinical concern for intracranial hemorrhage or other pathology at this time, very minor external evidence of trauma in this otherwise young healthy patient who is not on blood thinners and has a normal neurologic exam and has had prior brain imaging.  No findings to suggest underlying infection.  Doubt convulsive syncope.  Will screen with basic labs to ensure no significant began electrolyte abnormality.  Plan: -EKG - labs -Seizure precautions  Patient's presentation is most consistent with acute complicated illness / injury requiring diagnostic workup. The patient is on the cardiac monitor to evaluate for evidence of arrhythmia and/or significant heart rate changes.  ED course below.  Laboratory workup unremarkable, EKG unremarkable.  Return to baseline mental status, nonfocal neurologic exam.  Reiterated the patient cannot drive (already does not).  Loaded with his home dose of Keppra and 1 month Keppra prescription prescribed.  Plan for PMD  and neurology follow-up.  ED return precautions place.  Patient agrees with plan.  Clinical Course as of 09/13/23 1603  Mon Sep 13, 2023  1321 Cbc wnl [MM]  1336 Bmp wnl [MM]  1353 Ecg = sinus rhythm, rate 67, no ST elevation or depression, no significant repolarization abnormality, normal axis, normal intervals.  No evidence of ischemia nor arrhythmia on my read. [MM]    Clinical Course User Index [MM] Marinell Blight, MD     FINAL CLINICAL IMPRESSION(S) / ED DIAGNOSES   Final diagnoses:  Seizure (HCC)     Rx / DC Orders   ED Discharge Orders           Ordered    levETIRAcetam (KEPPRA) 500 MG tablet  2 times daily        09/13/23 1420             Note:  This document was prepared using Dragon voice recognition software and may include unintentional dictation errors.   Marinell Blight, MD 09/13/23 [phone]

## 2023-09-22 DIAGNOSIS — F09 Unspecified mental disorder due to known physiological condition: Secondary | ICD-10-CM | POA: Insufficient documentation

## 2023-09-22 DIAGNOSIS — G40909 Epilepsy, unspecified, not intractable, without status epilepticus: Secondary | ICD-10-CM | POA: Insufficient documentation

## 2023-09-22 NOTE — Patient Instructions (Incomplete)
 Seizure, Adult A seizure is a sudden burst of abnormal activity in the brain. Seizures usually last from 30 seconds to 2 minutes. There are many types of seizures. And they can cause many different symptoms. What are the causes? Common causes of a seizure include: Fever or infection. Problems that affect the brain. These may include: A brain or head injury. A stroke. A brain tumor. Low levels of blood sugar or salt. Kidney problems or liver problems. Some inherited conditions. These are passed down from parent to child. Problems with a substance, such as: Having a reaction to a drug or a medicine. Stopping the use of a substance all of a sudden. When this causes problems, it's called withdrawal. Disorders that affect how you develop, such as autism spectrum disorder or cerebral palsy. Sometimes, the cause may not be known. Some people who have a seizure never have another one. A person who has repeated seizures over time without a clear cause has a condition called epilepsy. What increases the risk? Having a family history of epilepsy. Having had a tonic-clonic seizure before. This type of seizure causes: The muscles of the whole body to tighten, or contract. Loss of consciousness. Having a head injury or a stroke in the past. Having had too little oxygen at birth. What are the signs or symptoms? The symptoms vary depending on the type of seizure you have. Symptoms during a seizure Having convulsions. This means shaking with fast, jerky movements of muscles. Stiffness of the body. Breathing problems. Being confused. Staring or not responding to sound or touch. Head nodding, eye blinking, eye twitching, or fast eye movements. Drooling, grunting, or making clicking sounds with your mouth. Losing control of when you pee or poop. Symptoms before a seizure Feeling afraid, worried, or nervous. Feeling like you may vomit. Vertigo. This feels like: You are moving when you're  not. Things around you are moving when they're not. Dj vu. This is a feeling of having seen or heard something before. Odd tastes or smells. Changes in how you see. You may see flashing lights or spots. Symptoms after a seizure Being confused. Feeling sleepy. Headache. Sore muscles. How is this diagnosed? A seizure may be diagnosed based on: A description of your symptoms. Video of your seizures can be helpful. Your medical history. A physical exam. Tests, such as: Blood tests. CT scan. MRI. Electroencephalogram, or EEG. This test measures electrical activity in the brain. A test of your spinal fluid. This is called a spinal tap or lumbar puncture. How is this treated? If your seizure stops on its own, you will not need treatment. If your seizure lasts longer than 5 minutes, you'll normally need treatment. This may include: Medicines given through an IV. Avoiding things, such as medicines, that are known to cause your seizures. Medicines to prevent seizures. These are called antiepileptics. A device to prevent or control seizures. Eating foods that are low in carbohydrates and high in fat (ketogenic diet). Surgery. This is sometimes needed if you keep having seizures. Follow these instructions at home: Medicines Take your medicines only as told by your health care provider. Avoid anything that may keep your medicine from working, such as alcohol. Activity Follow your provider's advice about driving, swimming, and doing other things that would be dangerous if you had a seizure. Wait until your provider says it's safe for you to do these things. If you live in the [place] ask your local department of motor vehicles [facility]) when you can drive. Get  enough rest and sleep. Not getting enough sleep can make seizures more likely to happen. Teaching others  Teach friends and family what to do if you have a seizure. Tell them to: Help you get down to the ground safely. Protect your head  and body. Loosen any clothing around your neck. Turn you on your side. This helps keep your airway clear if you vomit. Know whether or not you need emergency care. Stay with you until you are better. Also, tell them what not to do if you have a seizure. Tell them: They should not hold you down. They should not put anything in your mouth. General instructions Avoid anything that has caused you to have seizures. Keep a seizure diary. Write down: What you remember about each seizure. What you think might have caused each seizure. Keep all follow-up visits. Your provider may need to monitor your progress. Contact a health care provider if: You have another seizure or seizures. Call each time you have a seizure. You have a change in how often or when you have seizures. You keep having seizures with treatment. You have symptoms of being sick or having an infection. You are not able to take your medicine. Get help right away if: You have or someone has seen you have: A seizure that lasts longer than 5 minutes. Many seizures in a row and you don't feel better between seizures. A seizure that makes it harder to breathe. A seizure that leaves you unable to speak or use a part of your body. You didn't wake up right away after a seizure. You injure yourself during a seizure. You have confusion or pain right after a seizure. These symptoms may be an emergency. Call 911 right away. Do not wait to see if the symptoms will go away. Do not drive yourself to the hospital. This information is not intended to replace advice given to you by your health care provider. Make sure you discuss any questions you have with your health care provider. Document Revised: 02/25/2023 Document Reviewed: 07/08/2022 Elsevier Patient Education  2024 [employer].

## 2023-09-23 ENCOUNTER — Ambulatory Visit
Admission: RE | Admit: 2023-09-23 | Discharge: 2023-09-23 | Disposition: A | Discharge status: Home / Self Care | Source: Ambulatory Visit | Attending: Nurse Practitioner | Admitting: Nurse Practitioner

## 2023-09-23 ENCOUNTER — Ambulatory Visit (INDEPENDENT_AMBULATORY_CARE_PROVIDER_SITE_OTHER): Admitting: Nurse Practitioner

## 2023-09-23 ENCOUNTER — Ambulatory Visit
Admission: RE | Admit: 2023-09-23 | Discharge: 2023-09-23 | Disposition: A | Discharge status: Home / Self Care | Attending: Nurse Practitioner | Admitting: Nurse Practitioner

## 2023-09-23 ENCOUNTER — Other Ambulatory Visit: Payer: Self-pay | Admitting: Nurse Practitioner

## 2023-09-23 ENCOUNTER — Encounter: Payer: Self-pay | Admitting: Nurse Practitioner

## 2023-09-23 VITALS — BP 138/71 | HR 70 | Temp 97.5°F | Ht 68.1 in | Wt 170.6 lb

## 2023-09-23 DIAGNOSIS — M25512 Pain in left shoulder: Secondary | ICD-10-CM

## 2023-09-23 DIAGNOSIS — Z1159 Encounter for screening for other viral diseases: Secondary | ICD-10-CM | POA: Insufficient documentation

## 2023-09-23 DIAGNOSIS — G40909 Epilepsy, unspecified, not intractable, without status epilepticus: Secondary | ICD-10-CM | POA: Diagnosis not present

## 2023-09-23 DIAGNOSIS — Z7689 Persons encountering health services in other specified circumstances: Secondary | ICD-10-CM

## 2023-09-23 DIAGNOSIS — Z114 Encounter for screening for human immunodeficiency virus [HIV]: Secondary | ICD-10-CM

## 2023-09-23 DIAGNOSIS — F09 Unspecified mental disorder due to known physiological condition: Secondary | ICD-10-CM

## 2023-09-23 DIAGNOSIS — M25511 Pain in right shoulder: Secondary | ICD-10-CM | POA: Insufficient documentation

## 2023-09-23 DIAGNOSIS — E538 Deficiency of other specified B group vitamins: Secondary | ICD-10-CM

## 2023-09-23 MED ORDER — LEVETIRACETAM 500 MG PO TABS: 500.0000 mg | ORAL_TABLET | Freq: Two times a day (BID) | ORAL | 2 refills | Status: DC

## 2023-09-23 NOTE — Assessment & Plan Note (Signed)
 Acute, starting after landing on shoulder area while falling with recent seizure.  Will obtain imaging, discussed with patient.  Recommend he continue Tylenol and Ibuprofen as needed, per instructions on the bottle.  Recommend using Icy/Hot over the counter and heat to area.

## 2023-09-23 NOTE — Progress Notes (Signed)
 Contacted via MyChart   Good afternoon Grant Serrano, it was nice to meet you.  Your imaging returned and shows no fracture.  Good news.  I recommend to continue Ibuprofen or Tylenol if needed + get some Voltaren gel to rub on shoulder and use heating pad for discomfort.  Gently stretch it often.  No heavy lifting until pain improves.  If it continues at next visit we will get you into orthopedics.  Any questions? Keep being awesome!!  Thank you for allowing me to participate in your care.  I appreciate you. Kindest regards, Jamae Tison

## 2023-09-23 NOTE — Assessment & Plan Note (Signed)
 Chronic, with x 2 recent seizures: April 2025 and November 2024, both brought on due to missing medication doses for a period.  Will continue on Keppra 500 MG BID, refills sent in.  Will place referral to neurology to get back into specialist care, Dr. Mason Sole was previous provider.  Recommend not to miss doses and if no refills alert provider ASAP.  He is aware not to drive for 6 months.
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
## 2023-09-23 NOTE — Assessment & Plan Note (Signed)
 Chronic, ongoing.  No current supplement.  Recheck today, last check was 2023.  Start supplement as needed.
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
## 2023-09-24 ENCOUNTER — Encounter: Payer: Self-pay | Admitting: Nurse Practitioner

## 2023-09-24 NOTE — Progress Notes (Signed)
 Contacted via MyChart   Good morning Grant Serrano, your labs have returned and overall are stable. Waiting on calcium level only and if abnormal I will let you know.  Any questions? Keep being amazing!!  Thank you for allowing me to participate in your care.  I appreciate you. Kindest regards, [name]

## 2023-09-28 LAB — CBC WITH DIFFERENTIAL/PLATELET
Basophils Absolute: 0.1 10*3/uL (ref 0.0–0.2)
Basos: 1 %
EOS (ABSOLUTE): 0.3 10*3/uL (ref 0.0–0.4)
Eos: 5 %
Hematocrit: 46.6 % (ref 37.5–51.0)
Hemoglobin: 15.6 g/dL (ref 13.0–17.7)
Immature Grans (Abs): 0 10*3/uL (ref 0.0–0.1)
Immature Granulocytes: 0 %
Lymphocytes Absolute: 1.9 10*3/uL (ref 0.7–3.1)
Lymphs: 33 %
MCH: 31.6 pg (ref 26.6–33.0)
MCHC: 33.5 g/dL (ref 31.5–35.7)
MCV: 94 fL (ref 79–97)
Monocytes Absolute: 0.5 10*3/uL (ref 0.1–0.9)
Monocytes: 9 %
Neutrophils Absolute: 3 10*3/uL (ref 1.4–7.0)
Neutrophils: 52 %
Platelets: 266 10*3/uL (ref 150–450)
RBC: 4.94 x10E6/uL (ref 4.14–5.80)
RDW: 11.9 % (ref 11.6–15.4)
WBC: 5.7 10*3/uL (ref 3.4–10.8)

## 2023-09-28 LAB — COMPREHENSIVE METABOLIC PANEL WITH GFR
ALT: 13 IU/L (ref 0–44)
AST: 17 IU/L (ref 0–40)
Albumin: 4.6 g/dL (ref 4.1–5.1)
Alkaline Phosphatase: 109 IU/L (ref 44–121)
BUN/Creatinine Ratio: 11 (ref 9–20)
BUN: 11 mg/dL (ref 6–20)
Bilirubin Total: 0.5 mg/dL (ref 0.0–1.2)
CO2: 24 mmol/L (ref 20–29)
Calcium: 9.4 mg/dL (ref 8.7–10.2)
Chloride: 101 mmol/L (ref 96–106)
Creatinine, Ser: 1 mg/dL (ref 0.76–1.27)
Globulin, Total: 2.5 g/dL (ref 1.5–4.5)
Glucose: 89 mg/dL (ref 70–99)
Potassium: 3.9 mmol/L (ref 3.5–5.2)
Sodium: 140 mmol/L (ref 134–144)
Total Protein: 7.1 g/dL (ref 6.0–8.5)
eGFR: 99 mL/min/{1.73_m2} (ref >59)

## 2023-09-28 LAB — HEPATITIS C ANTIBODY: Hep C Virus Ab: NONREACTIVE

## 2023-09-28 LAB — TSH: TSH: 3.24 u[IU]/mL (ref 0.450–4.500)

## 2023-09-28 LAB — MAGNESIUM: Magnesium: 2.1 mg/dL (ref 1.6–2.3)

## 2023-09-28 LAB — VITAMIN B12: Vitamin B-12: 536 pg/mL (ref 232–1245)

## 2023-09-28 LAB — HIV ANTIBODY (ROUTINE TESTING W REFLEX): HIV Screen 4th Generation wRfx: NONREACTIVE

## 2023-10-15 ENCOUNTER — Encounter (HOSPITAL_COMMUNITY): Payer: Self-pay

## 2023-10-17 NOTE — Patient Instructions (Signed)
 Shoulder Pain  Many things can cause shoulder pain, including:  An injury.  Moving the shoulder in the same way again and again (overuse).  Joint pain (arthritis).  Pain can come from:  Swelling and irritation (inflammation) of any part of the shoulder.  An injury to:  The shoulder joint.  Tissues that connect muscle to bone (tendons).  Tissues that connect bones to each other (ligaments).  Bones.  Follow these instructions at home:  Watch for changes in your symptoms. Let your doctor know about them. Follow these instructions to help with your pain.  If you have a sling that can be taken off:  Wear the sling as told by your doctor. Take it off only as told by your doctor.  Check the skin around the sling every day. Tell your doctor if you see problems.  Loosen the sling if your fingers:  Tingle.  Become numb.  Become cold.  Keep the sling clean.  If the sling is not waterproof:  Do not let it get wet.  Take the sling off when you shower or bathe.  Managing pain, stiffness, and swelling    If told, put ice on the painful area.  Put ice in a plastic bag.  Place a towel between your skin and the bag.  Leave the ice on for 20 minutes, 2-3 times a day. Stop putting ice on if it does not help with the pain.  If your skin turns bright red, take off the ice right away to prevent skin damage. The risk of damage is higher if you cannot feel pain, heat, or cold.  Squeeze a soft ball or a foam pad as much as possible. This prevents swelling in the shoulder. It also helps to strengthen the arm.  General instructions  Take over-the-counter and prescription medicines only as told by your doctor.  Keep all follow-up visits. This will help you avoid any type of permanent shoulder problems.  Contact a doctor if:  Your pain gets worse.  Medicine does not help your pain.  You have new pain in your arm, hand, or fingers.  You loosen your sling and your arm, hand, or fingers:  Tingle.  Are numb.  Are swollen.  Get help right away  if:  Your arm, hand, or fingers turn white or blue.  This information is not intended to replace advice given to you by your health care provider. Make sure you discuss any questions you have with your health care provider.  Document Revised: 12/26/2021 Document Reviewed: 12/26/2021  Elsevier Patient Education  2024 [employer].

## 2023-10-22 ENCOUNTER — Encounter: Payer: Self-pay | Admitting: Nurse Practitioner

## 2023-10-22 ENCOUNTER — Ambulatory Visit (INDEPENDENT_AMBULATORY_CARE_PROVIDER_SITE_OTHER): Admitting: Nurse Practitioner

## 2023-10-22 VITALS — BP 109/68 | HR 65 | Temp 98.3°F | Wt 162.4 lb

## 2023-10-22 DIAGNOSIS — G40909 Epilepsy, unspecified, not intractable, without status epilepticus: Secondary | ICD-10-CM | POA: Diagnosis not present

## 2023-10-22 DIAGNOSIS — M25511 Pain in right shoulder: Secondary | ICD-10-CM

## 2023-10-22 MED ORDER — LEVETIRACETAM 500 MG PO TABS: 500.0000 mg | ORAL_TABLET | Freq: Two times a day (BID) | ORAL | 0 refills | Status: DC

## 2023-10-22 NOTE — Assessment & Plan Note (Signed)
 Chronic, with x 2 recent seizures: April 2025 and November 2024, both brought on due to missing medication doses for a period.  Will continue on Keppra  500 MG BID, refills sent in.  Scheduled to see neurology upcoming. Recommend not to miss doses and if no refills alert provider ASAP.  He is aware not to drive for 6 months.
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
## 2023-10-22 NOTE — Assessment & Plan Note (Signed)
 Acute, improved. Will continue to monitor closely.

## 2023-11-23 DIAGNOSIS — Z87820 Personal history of traumatic brain injury: Secondary | ICD-10-CM | POA: Diagnosis not present

## 2023-11-23 DIAGNOSIS — Z1331 Encounter for screening for depression: Secondary | ICD-10-CM | POA: Diagnosis not present

## 2023-11-23 DIAGNOSIS — R569 Unspecified convulsions: Secondary | ICD-10-CM | POA: Diagnosis not present

## 2024-01-10 ENCOUNTER — Telehealth: Payer: Self-pay | Admitting: Nurse Practitioner

## 2024-01-10 NOTE — Telephone Encounter (Signed)
 Copied from CRM [id number]. Topic: Medicare AWV >> Jan 10, 2024  9:25 AM Nathanel DEL wrote: Reason for CRM: Called LVM 01/10/2024 to schedule AWV. Please schedule office or virtual visits.  Nathanel Paschal; Care Guide Ambulatory Clinical Support [place] [facility] Direct Dial: [phone]

## 2024-04-22 NOTE — Patient Instructions (Signed)
 Healthy Eating, Adult Healthy eating may help you get and keep a healthy body weight, reduce the risk of chronic disease, and live a long and productive life. It is important to follow a healthy eating pattern. Your nutritional and calorie needs should be met mainly by different nutrient-rich foods. What are tips for following this plan? Reading food labels Read labels and choose the following: Reduced or low sodium products. Juices with 100% fruit juice. Foods with low saturated fats (<3 g per serving) and high polyunsaturated and monounsaturated fats. Foods with whole grains, such as whole wheat, cracked wheat, brown rice, and wild rice. Whole grains that are fortified with folic acid . This is recommended for females who are pregnant or who want to become pregnant. Read labels and do not eat or drink the following: Foods or drinks with added sugars. These include foods that contain brown sugar, corn sweetener, corn syrup, dextrose , fructose, glucose, high-fructose corn syrup, honey, invert sugar, lactose, malt syrup, maltose, molasses, raw sugar, sucrose, trehalose, or turbinado sugar. Limit your intake of added sugars to less than 10% of your total daily calories. Do not eat more than the following amounts of added sugar per day: 6 teaspoons (25 g) for females. 9 teaspoons (38 g) for males. Foods that contain processed or refined starches and grains. Refined grain products, such as white flour, degermed cornmeal, white bread, and white rice. Shopping Choose nutrient-rich snacks, such as vegetables, whole fruits, and nuts. Avoid high-calorie and high-sugar snacks, such as potato chips, fruit snacks, and candy. Use oil-based dressings and spreads on foods instead of solid fats such as butter, margarine, sour cream, or cream cheese. Limit pre-made sauces, mixes, and instant products such as flavored rice, instant noodles, and ready-made pasta. Try more plant-protein sources, such as tofu,  tempeh, black beans, edamame, lentils, nuts, and seeds. Explore eating plans such as the Mediterranean diet or vegetarian diet. Try heart-healthy dips made with beans and healthy fats like hummus and guacamole. Vegetables go great with these. Cooking Use oil to saut or stir-fry foods instead of solid fats such as butter, margarine, or lard. Try baking, boiling, grilling, or broiling instead of frying. Remove the fatty part of meats before cooking. Steam vegetables in water  or broth. Meal planning  At meals, imagine dividing your plate into fourths: One-half of your plate is fruits and vegetables. One-fourth of your plate is whole grains. One-fourth of your plate is protein, especially lean meats, poultry, eggs, tofu, beans, or nuts. Include low-fat dairy as part of your daily diet. Lifestyle Choose healthy options in all settings, including home, work, school, restaurants, or stores. Prepare your food safely: Wash your hands after handling raw meats. Where you prepare food, keep surfaces clean by regularly washing with hot, soapy water . Keep raw meats separate from ready-to-eat foods, such as fruits and vegetables. Cook seafood, meat, poultry, and eggs to the recommended temperature. Get a food thermometer. Store foods at safe temperatures. In general: Keep cold foods at 34F (4.4C) or below. Keep hot foods at 134F (60C) or above. Keep your freezer at [facility] (-17.8C) or below. Foods are not safe to eat if they have been between the temperatures of 40-134F (4.4-60C) for more than 2 hours. What foods should I eat? Fruits Aim to eat 1-2 cups of fresh, canned (in natural juice), or frozen fruits each day. One cup of fruit equals 1 small apple, 1 large banana, 8 large strawberries, 1 cup (237 g) canned fruit,  cup (82 g) dried fruit,  or 1 cup (240 mL) 100% juice. Vegetables Aim to eat 2-4 cups of fresh and frozen vegetables each day, including different varieties and colors. One cup  of vegetables equals 1 cup (91 g) broccoli or cauliflower florets, 2 medium carrots, 2 cups (150 g) raw, leafy greens, 1 large tomato, 1 large bell pepper, 1 large sweet potato, or 1 medium white potato. Grains Aim to eat 5-10 ounce-equivalents of whole grains each day. Examples of 1 ounce-equivalent of grains include 1 slice of bread, 1 cup (40 g) ready-to-eat cereal, 3 cups (24 g) popcorn, or  cup (93 g) cooked rice. Meats and other proteins Try to eat 5-7 ounce-equivalents of protein each day. Examples of 1 ounce-equivalent of protein include 1 egg,  oz nuts (12 almonds, 24 pistachios, or 7 walnut halves), 1/4 cup (90 g) cooked beans, 6 tablespoons (90 g) hummus or 1 tablespoon (16 g) peanut butter. A cut of meat or fish that is the size of a deck of cards is about 3-4 ounce-equivalents (85 g). Of the protein you eat each week, try to have at least 8 sounce (227 g) of seafood. This is about 2 servings per week. This includes salmon, trout, herring, sardines, and anchovies. Dairy Aim to eat 3 cup-equivalents of fat-free or low-fat dairy each day. Examples of 1 cup-equivalent of dairy include 1 cup (240 mL) milk, 8 ounces (250 g) yogurt, 1 ounces (44 g) natural cheese, or 1 cup (240 mL) fortified soy milk. Fats and oils Aim for about 5 teaspoons (21 g) of fats and oils per day. Choose monounsaturated fats, such as canola and olive oils, mayonnaise made with olive oil or avocado oil, avocados, peanut butter, and most nuts, or polyunsaturated fats, such as sunflower, corn, and soybean oils, walnuts, pine nuts, sesame seeds, sunflower seeds, and flaxseed. Beverages Aim for 6 eight-ounce glasses of water  per day. Limit coffee to 3-5 eight-ounce cups per day. Limit caffeinated beverages that have added calories, such as soda and energy drinks. If you drink alcohol: Limit how much you have to: 0-1 drink a day if you are male. 0-2 drinks a day if you are male. Know how much alcohol is in your drink.  In the [place] one drink is one 12 oz bottle of beer (355 mL), one 5 oz glass of wine (148 mL), or one 1 oz glass of hard liquor (44 mL). Seasoning and other foods Try not to add too much salt to your food. Try using herbs and spices instead of salt. Try not to add sugar to food. This information is based on [place] nutrition guidelines. To learn more, visit [employer]. Exact amounts may vary. You may need different amounts. This information is not intended to replace advice given to you by your health care provider. Make sure you discuss any questions you have with your health care provider. Document Revised: 02/23/2022 Document Reviewed: 02/23/2022 Elsevier Patient Education  2024 [employer].

## 2024-04-24 ENCOUNTER — Ambulatory Visit: Admitting: Nurse Practitioner

## 2024-04-24 ENCOUNTER — Encounter: Payer: Self-pay | Admitting: Nurse Practitioner

## 2024-04-24 VITALS — BP 130/80 | HR 66 | Temp 98.4°F | Resp 14 | Ht 68.11 in | Wt 165.0 lb

## 2024-04-24 DIAGNOSIS — G40909 Epilepsy, unspecified, not intractable, without status epilepticus: Secondary | ICD-10-CM

## 2024-04-24 MED ORDER — LEVETIRACETAM 500 MG PO TABS: 500.0000 mg | ORAL_TABLET | Freq: Two times a day (BID) | ORAL | 3 refills | Status: AC

## 2024-04-24 NOTE — Assessment & Plan Note (Signed)
 Chronic, with no recent seizures. Last were April 2025 and November 2024, both brought on due to missing medication doses for a period.  Will continue on Keppra  500 MG BID, refills sent in.  Recommend not to miss doses and if no refills alert provider ASAP.  Continue collaboration with neurology, recent note reviewed.
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
## 2024-06-15 ENCOUNTER — Ambulatory Visit: Admitting: Emergency Medicine

## 2024-06-15 VITALS — BP 112/66 | Ht 67.5 in | Wt 165.8 lb

## 2024-06-15 DIAGNOSIS — Z0000 Encounter for general adult medical examination without abnormal findings: Secondary | ICD-10-CM | POA: Diagnosis not present

## 2024-06-15 NOTE — Patient Instructions (Signed)
 Grant Serrano,  Thank you for taking the time for your Medicare Wellness Visit. I appreciate your continued commitment to your health goals. Please review the care plan we discussed, and feel free to reach out if I can assist you further.  Please note that Annual Wellness Visits do not include a physical exam. Some assessments may be limited, especially if the visit was conducted virtually. If needed, we may recommend an in-person follow-up with your provider.  Ongoing Care Seeing your primary care provider every 3 to 6 months helps us  monitor your health and provide consistent, personalized care.   Referrals If a referral was made during today's visit and you haven't received any updates within two weeks, please contact the referred provider directly to check on the status.  Recommended Screenings:   Recommend getting a routine eye exam every 1-2 years. I have included a list of eye doctors in the area.   Health Maintenance  Topic Date Due   Medicare Annual Wellness Visit  Never done   COVID-19 Vaccine (1 - 2025-26 season) Never done   Flu Shot  09/05/2024*   Hepatitis B Vaccine (1 of 3 - 19+ 3-dose series) 04/22/2025*   HPV Vaccine (1 - 3-dose SCDM series) 04/22/2025*   DTaP/Tdap/Td vaccine (2 - Td or Tdap) 07/17/2032   Hepatitis C Screening  Completed   HIV Screening  Completed   Pneumococcal Vaccine  Aged Out   Meningitis B Vaccine  Aged Out  *Topic was postponed. The date shown is not the original due date.       06/15/2024    1:15 PM  Advanced Directives  Does Patient Have a Medical Advance Directive? No  Would patient like information on creating a medical advance directive? Yes (MAU/Ambulatory/Procedural Areas - Information given)    Vision: Annual vision screenings are recommended for early detection of glaucoma, cataracts, and diabetic retinopathy. These exams can also reveal signs of chronic conditions such as diabetes and high blood pressure.  Dental: Annual dental  screenings help detect early signs of oral cancer, gum disease, and other conditions linked to overall health, including heart disease and diabetes.  Please see the attached documents for additional preventive care recommendations.   There are several Eye Doctors in your area. Here are a few that usually accept all insurance types:  [facility] [address] [place], [place] [address] Phone: [phone]  [facility] [address] [place], [place] [address] Phone: [phone]  [facility] [address] [place], [place] [address] Phone: [phone]  [facility] [address] [place], [place] [address] Phone: [phone]  [facility] [address] [place], [place] [address] Phone: [phone]  [facility] [address] [place], [place] [address] Phone: [phone]

## 2024-06-15 NOTE — Progress Notes (Signed)
 "  Chief Complaint  Patient presents with   Medicare Wellness     Subjective:   Grant Serrano is a 39 y.o. male who presents for a Medicare Annual Wellness Visit.  Visit info / Clinical Intake: Medicare Wellness Visit Type:: Subsequent Annual Wellness Visit Persons participating in visit and providing information:: patient Medicare Wellness Visit Mode:: In-person (required for WTM) Interpreter Needed?: No Pre-visit prep was completed: yes AWV questionnaire completed by patient prior to visit?: yes Date:: 06/14/24 Living arrangements:: with family/others; other (boarding house) Patient's Overall Health Status Rating: good Typical amount of pain: some Does pain affect daily life?: no Are you currently prescribed opioids?: no  Dietary Habits and Nutritional Risks How many meals a day?: 3 Eats fruit and vegetables daily?: yes Most meals are obtained by: preparing own meals In the last 2 weeks, have you had any of the following?: none Diabetic:: no  Functional Status Activities of Daily Living (to include ambulation/medication): Independent Ambulation: Independent Medication Administration: Independent Home Management (perform basic housework or laundry): Independent Manage your own finances?: yes Primary transportation is: driving (scooter) Concerns about vision?: no *vision screening is required for WTM* Concerns about hearing?: no  Fall Screening Falls in the past year?: 0 Number of falls in past year: 0 Was there an injury with Fall?: 0 Fall Risk Category Calculator: 0 Patient Fall Risk Level: Low Fall Risk  Fall Risk Patient at Risk for Falls Due to: No Fall Risks Fall risk Follow up: Falls evaluation completed  Home and Transportation Safety: All rugs have non-skid backing?: N/A, no rugs All stairs or steps have railings?: yes (also has a ramp) Grab bars in the bathtub or shower?: yes Have non-skid surface in bathtub or shower?: yes Good home lighting?:  yes Regular seat belt use?: yes Hospital stays in the last year:: no  Cognitive Assessment Difficulty concentrating, remembering, or making decisions? : no Will 6CIT or Mini Cog be Completed: yes What year is it?: 0 points What month is it?: 0 points Give patient an address phrase to remember (5 components): [address] [place] About what time is it?: 0 points Count backwards from 20 to 1: 0 points Say the months of the year in reverse: 0 points Repeat the address phrase from earlier: 0 points 6 CIT Score: 0 points  Advance Directives (For Healthcare) Does Patient Have a Medical Advance Directive?: No Would patient like information on creating a medical advance directive?: Yes (MAU/Ambulatory/Procedural Areas - Information given)  Reviewed/Updated  Reviewed/Updated: Reviewed All (Medical, Surgical, Family, Medications, Allergies, Care Teams, Patient Goals)    Allergies (verified) Patient has no known allergies.   Current Medications (verified) Outpatient Encounter Medications as of 06/15/2024  Medication Sig   levETIRAcetam  (KEPPRA ) 500 MG tablet Take 1 tablet (500 mg total) by mouth 2 (two) times daily.   No facility-administered encounter medications on file as of 06/15/2024.    History: Past Medical History:  Diagnosis Date   Anemia    Seizures (HCC)    History reviewed. No pertinent surgical history. Family History  Problem Relation Age of Onset   Other Mother        car accident   Lung cancer Father    Social History   Occupational History   Occupation: disability  Tobacco Use   Smoking status: Never   Smokeless tobacco: Current    Types: Snuff  Vaping Use   Vaping status: Never Used  Substance and Sexual Activity   Alcohol use: Never  Drug use: Never   Sexual activity: Yes   Tobacco Counseling Ready to quit: Not Answered Counseling given: Not Answered  SDOH Screenings   Food Insecurity: No Food Insecurity (06/15/2024)  Housing: Low Risk  (06/15/2024)  Recent Concern: Housing - High Risk (04/23/2024)  Transportation Needs: No Transportation Needs (06/15/2024)  Utilities: Not At Risk (06/15/2024)  Alcohol Screen: Low Risk (04/23/2024)  Depression (PHQ2-9): Low Risk (06/15/2024)  Financial Resource Strain: Low Risk (04/23/2024)  Physical Activity: Sufficiently Active (06/15/2024)  Social Connections: Moderately Isolated (06/15/2024)  Stress: No Stress Concern Present (06/15/2024)  Tobacco Use: High Risk (06/15/2024)  Health Literacy: Inadequate Health Literacy (06/15/2024)   See flowsheets for full screening details  Depression Screen PHQ 2 & 9 Depression Scale- Over the past 2 weeks, how often have you been bothered by any of the following problems? Little interest or pleasure in doing things: 0 Feeling down, depressed, or hopeless (PHQ Adolescent also includes...irritable): 0 PHQ-2 Total Score: 0 Trouble falling or staying asleep, or sleeping too much: 0 Feeling tired or having little energy: 0 Poor appetite or overeating (PHQ Adolescent also includes...weight loss): 0 Feeling bad about yourself - or that you are a failure or have let yourself or your family down: 0 Trouble concentrating on things, such as reading the newspaper or watching television (PHQ Adolescent also includes...like school work): 0 Moving or speaking so slowly that other people could have noticed. Or the opposite - being so fidgety or restless that you have been moving around a lot more than usual: 0 Thoughts that you would be better off dead, or of hurting yourself in some way: 0 PHQ-9 Total Score: 0 If you checked off any problems, how difficult have these problems made it for you to do your work, take care of things at home, or get along with other people?: Not difficult at all  Depression Treatment Depression Interventions/Treatment : [id number] Score <4 Follow-up Not Indicated     Goals Addressed               This Visit's Progress     Exercise 150 min/wk  Moderate Activity (pt-stated)               Objective:    Today's Vitals   06/15/24 1311  BP: 112/66  Weight: 165 lb 12.8 oz (75.2 kg)  Height: 5' 7.5 (1.715 m)   Body mass index is 25.58 kg/m.  Hearing/Vision screen Hearing Screening - Comments:: Denies hearing loss  Vision Screening - Comments:: Needs a routine eye exam. Included list in AVS. Immunizations and Health Maintenance Health Maintenance  Topic Date Due   COVID-19 Vaccine (1 - 2025-26 season) Never done   Influenza Vaccine  09/05/2024 (Originally 01/07/2024)   Hepatitis B Vaccines 19-59 Average Risk (1 of 3 - 19+ 3-dose series) 04/22/2025 (Originally 03/14/2005)   HPV VACCINES (1 - 3-dose SCDM series) 04/22/2025 (Originally 03/14/2013)   Medicare Annual Wellness (AWV)  06/15/2025   DTaP/Tdap/Td (2 - Td or Tdap) 07/17/2032   Hepatitis C Screening  Completed   HIV Screening  Completed   Pneumococcal Vaccine  Aged Out   Meningococcal B Vaccine  Aged Out        Assessment/Plan:  This is a routine wellness examination for Byrd.  Patient Care Team: Cannady, Jolene T, NP as PCP - General (Nurse Practitioner) Maree Jannett POUR, MD as Consulting Physician (Neurology)  I have personally reviewed and noted the following in the patients chart:   Medical and social history Use  of alcohol, tobacco or illicit drugs  Current medications and supplements including opioid prescriptions. Functional ability and status Nutritional status Physical activity Advanced directives List of other physicians Hospitalizations, surgeries, and ER visits in previous 12 months Vitals Screenings to include cognitive, depression, and falls Referrals and appointments  No orders of the defined types were placed in this encounter.  In addition, I have reviewed and discussed with patient certain preventive protocols, quality metrics, and best practice recommendations. A written personalized care plan for preventive services as well as  general preventive health recommendations were provided to patient.   Vina Ned, CMA   06/15/2024   Return in 1 year (on 06/26/2025) for Medicare Annual Wellness Visit.  After Visit Summary: (In Person-Printed) AVS printed and given to the patient  Nurse Notes:  Needs a routine eye exam. Included a list of eye doctors in AVS. Declined flu and covid vaccines. "

## 2024-10-24 ENCOUNTER — Encounter: Admitting: Nurse Practitioner

## 2025-06-26 ENCOUNTER — Ambulatory Visit
